# Patient Record
Sex: Female | Born: 2000 | Race: Black or African American | Hispanic: No | Marital: Single | Smoking: Never smoker
Health system: Southern US, Community
[De-identification: ages and names within clinical notes are randomized; demographics above are authoritative.]

## PROBLEM LIST (undated history)

## (undated) ENCOUNTER — Ambulatory Visit: Payer: MEDICAID | Source: Home / Self Care

## (undated) DIAGNOSIS — T7840XA Allergy, unspecified, initial encounter: Secondary | ICD-10-CM

## (undated) DIAGNOSIS — I10 Essential (primary) hypertension: Secondary | ICD-10-CM

## (undated) DIAGNOSIS — E669 Obesity, unspecified: Secondary | ICD-10-CM

## (undated) DIAGNOSIS — F419 Anxiety disorder, unspecified: Secondary | ICD-10-CM

## (undated) HISTORY — PX: EYE SURGERY: SHX253

## (undated) HISTORY — DX: Allergy, unspecified, initial encounter: T78.40XA

## (undated) HISTORY — DX: Obesity, unspecified: E66.9

## (undated) HISTORY — DX: Anxiety disorder, unspecified: F41.9

---
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
## 2022-02-09 ENCOUNTER — Encounter: Payer: Self-pay | Admitting: Pediatrics

## 2022-03-14 ENCOUNTER — Telehealth: Payer: Medicaid Other | Admitting: Physician Assistant

## 2022-03-14 DIAGNOSIS — B3731 Acute candidiasis of vulva and vagina: Secondary | ICD-10-CM

## 2022-03-14 MED ORDER — FLUCONAZOLE 150 MG PO TABS: 150.0000 mg | ORAL_TABLET | ORAL | 0 refills | Status: DC | PRN

## 2022-03-14 NOTE — Patient Instructions (Signed)
?Carla [name], thank you for joining [name], PA-C for today's virtual visit.  While this provider is not your primary care provider (PCP), if your PCP is located in our provider database this encounter information will be shared with them immediately following your visit. ? ?Consent: ?(Patient) Carla [name] provided verbal consent for this virtual visit at the beginning of the encounter. ? ?Current Medications: ? ?Current Outpatient Medications:  ?  fluconazole (DIFLUCAN) 150 MG tablet, Take 1 tablet (150 mg total) by mouth every 3 (three) days as needed., Disp: 2 tablet, Rfl: 0 ?  cefixime (SUPRAX) 400 MG CAPS capsule, Take 1 tablet by mouth once., Disp: 2 capsule, Rfl: 0 ?  doxycycline (VIBRA-TABS) 100 MG tablet, Take 1 tablet (100 mg total) by mouth 2 (two) times daily., Disp: 14 tablet, Rfl: 0 ?  naproxen (NAPROSYN) 500 MG tablet, Take 500 mg by mouth daily as needed., Disp: , Rfl:   ? ?Medications ordered in this encounter:  ?Meds ordered this encounter  ?Medications  ? fluconazole (DIFLUCAN) 150 MG tablet  ?  Sig: Take 1 tablet (150 mg total) by mouth every 3 (three) days as needed.  ?  Dispense:  2 tablet  ?  Refill:  0  ?  Order Specific Question:   Supervising Provider  ?  Answer:   Eber Hong [[id number]]  ?  ? ?*If you need refills on other medications prior to your next appointment, please contact your pharmacy* ? ?Follow-Up: ?Call back or seek an in-person evaluation if the symptoms worsen or if the condition fails to improve as anticipated. ? ?Other Instructions ?Vaginal Yeast Infection, Adult ? ?Vaginal yeast infection is a condition that causes vaginal discharge as well as soreness, swelling, and redness (inflammation) of the vagina. This is a common condition. Some women get this infection frequently. ?What are the causes? ?This condition is caused by a change in the normal balance of the yeast (Candida) and normal bacteria that live in the vagina. This change causes an overgrowth of  yeast, which causes the inflammation. ?What increases the risk? ?The condition is more likely to develop in women who: ?Take antibiotic medicines. ?Have diabetes. ?Take birth control pills. ?Are pregnant. ?Douche often. ?Have a weak body defense system (immune system). ?Have been taking steroid medicines for a long time. ?Frequently wear tight clothing. ?What are the signs or symptoms? ?Symptoms of this condition include: ?White, thick, creamy vaginal discharge. ?Swelling, itching, redness, and irritation of the vagina. The lips of the vagina (labia) may be affected as well. ?Pain or a burning feeling while urinating. ?Pain during sex. ?How is this diagnosed? ?This condition is diagnosed based on: ?Your medical history. ?A physical exam. ?A pelvic exam. Your health care provider will examine a sample of your vaginal discharge under a microscope. Your health care provider may send this sample for testing to confirm the diagnosis. ?How is this treated? ?This condition is treated with medicine. Medicines may be over-the-counter or prescription. You may be told to use one or more of the following: ?Medicine that is taken by mouth (orally). ?Medicine that is applied as a cream (topically). ?Medicine that is inserted directly into the vagina (suppository). ?Follow these instructions at home: ?Take or apply over-the-counter and prescription medicines only as told by your health care provider. ?Do not use tampons until your health care provider approves. ?Do not have sex until your infection has cleared. Sex can prolong or worsen your symptoms of infection. Ask your health care provider when it  is safe to resume sexual activity. ?Keep all follow-up visits. This is important. ?How is this prevented? ? ?Do not wear tight clothes, such as pantyhose or tight pants. ?Wear breathable cotton underwear. ?Do not use douches, perfumed soap, creams, or powders. ?Wipe from front to back after using the toilet. ?If you have diabetes,  keep your blood sugar levels under control. ?Ask your health care provider for other ways to prevent yeast infections. ?Contact a health care provider if: ?You have a fever. ?Your symptoms go away and then return. ?Your symptoms do not get better with treatment. ?Your symptoms get worse. ?You have new symptoms. ?You develop blisters in or around your vagina. ?You have blood coming from your vagina and it is not your menstrual period. ?You develop pain in your abdomen. ?Summary ?Vaginal yeast infection is a condition that causes discharge as well as soreness, swelling, and redness (inflammation) of the vagina. ?This condition is treated with medicine. Medicines may be over-the-counter or prescription. ?Take or apply over-the-counter and prescription medicines only as told by your health care provider. ?Do not douche. Resume sexual activity or use of tampons as instructed by your health care provider. ?Contact a health care provider if your symptoms do not get better with treatment or your symptoms go away and then return. ?This information is not intended to replace advice given to you by your health care provider. Make sure you discuss any questions you have with your health care provider. ?Document Revised: 01/04/2021 Document Reviewed: 01/04/2021 ?Elsevier Patient Education ? 2023 Elsevier Inc. ? ? ? ?If you have been instructed to have an in-person evaluation today at a local Urgent Care facility, please use the link below. It will take you to a list of all of our available [facility], including address, phone number and hours of operation. Please do not delay care.  ?[facility] ? ?If you or a family member do not have a primary care provider, use the link below to schedule a visit and establish care. When you choose a [facility] primary care physician or advanced practice provider, you gain a long-term partner in health. ?Find a Primary Care Provider ? ?Learn more about [facility]'s  in-office and virtual care options: ?[facility] - Get Care Now ?

## 2022-03-14 NOTE — Progress Notes (Signed)
?Virtual Visit Consent  ? ?Carla [name], you are scheduled for a virtual visit with a [facility] provider today. Just as with appointments in the office, your consent must be obtained to participate. Your consent will be active for this visit and any virtual visit you may have with one of our providers in the next 365 days. If you have a MyChart account, a copy of this consent can be sent to you electronically. ? ?As this is a virtual visit, video technology does not allow for your provider to perform a traditional examination. This may limit your provider's ability to fully assess your condition. If your provider identifies any concerns that need to be evaluated in person or the need to arrange testing (such as labs, EKG, etc.), we will make arrangements to do so. Although advances in technology are sophisticated, we cannot ensure that it will always work on either your end or our end. If the connection with a video visit is poor, the visit may have to be switched to a telephone visit. With either a video or telephone visit, we are not always able to ensure that we have a secure connection. ? ?By engaging in this virtual visit, you consent to the provision of healthcare and authorize for your insurance to be billed (if applicable) for the services provided during this visit. Depending on your insurance coverage, you may receive a charge related to this service. ? ?I need to obtain your verbal consent now. Are you willing to proceed with your visit today? Carla [name] has provided verbal consent on 03/14/2022 for a virtual visit (video or telephone). Mar Daring, PA-C ? ?Date: 03/14/2022 2:49 PM ? ?Virtual Visit via Video Note  ? ?Carla [name], connected with  Carla [name]  ([id number], 07/11/2001) on 03/14/22 at  2:45 PM EDT by a video-enabled telemedicine application and verified that I am speaking with the correct person using two identifiers. ? ?Location: ?Patient: Virtual Visit Location Patient:  Home ?Provider: Virtual Visit Location Provider: Home Office ?  ?I discussed the limitations of evaluation and management by telemedicine and the availability of in person appointments. The patient expressed understanding and agreed to proceed.   ? ?History of Present Illness: ?Carla [name] is a 21 y.o. who identifies as a female who was assigned female at birth, and is being seen today for vaginal discharge. Started last week. Having thick, white discharge. Having associated itching. Having increased moisture that is making area itchy.  ? ? ?Problems:  ?Patient Active Problem List  ? Diagnosis Date Noted  ? Throbbing headache 12/06/2019  ? MDD (major depressive disorder), severe ([facility]) 12/07/2017  ?  ?Allergies:  ?Allergies  ?Allergen Reactions  ? Other Hives  ?  "Canned pineapples"  ? ?Medications:  ?Current Outpatient Medications:  ?  fluconazole (DIFLUCAN) 150 MG tablet, Take 1 tablet (150 mg total) by mouth every 3 (three) days as needed., Disp: 2 tablet, Rfl: 0 ?  cefixime (SUPRAX) 400 MG CAPS capsule, Take 1 tablet by mouth once., Disp: 2 capsule, Rfl: 0 ?  doxycycline (VIBRA-TABS) 100 MG tablet, Take 1 tablet (100 mg total) by mouth 2 (two) times daily., Disp: 14 tablet, Rfl: 0 ?  naproxen (NAPROSYN) 500 MG tablet, Take 500 mg by mouth daily as needed., Disp: , Rfl:  ? ?Observations/Objective: ?Patient is well-developed, well-nourished in no acute distress.  ?Resting comfortably at home.  ?Head is normocephalic, atraumatic.  ?No labored breathing.  ?Speech is clear and coherent with logical content.  ?  Patient is alert and oriented at baseline.  ? ? ?Assessment and Plan: ?1. Yeast infection of the vagina ?- fluconazole (DIFLUCAN) 150 MG tablet; Take 1 tablet (150 mg total) by mouth every 3 (three) days as needed.  Dispense: 2 tablet; Refill: 0 ? ?- Suspect yeast infection ?- Diflucan prescribed ?- Vaginal probiotics if interested for prevention ?- Seek in person evaluation for testing if not responding to  medication or if symptoms worsen ? ? ?Follow Up Instructions: ?I discussed the assessment and treatment plan with the patient. The patient was provided an opportunity to ask questions and all were answered. The patient agreed with the plan and demonstrated an understanding of the instructions.  A copy of instructions were sent to the patient via MyChart unless otherwise noted below.  ? ? ?The patient was advised to call back or seek an in-person evaluation if the symptoms worsen or if the condition fails to improve as anticipated. ? ?Time:  ?I spent 10 minutes with the patient via telehealth technology discussing the above problems/concerns.   ? ?Mar Daring, PA-C ?

## 2022-03-21 ENCOUNTER — Ambulatory Visit: Payer: Medicaid Other

## 2022-03-23 ENCOUNTER — Encounter: Payer: Medicaid Other | Admitting: Radiology

## 2022-04-29 ENCOUNTER — Telehealth: Payer: Medicaid Other | Admitting: Physician Assistant

## 2022-04-29 DIAGNOSIS — N898 Other specified noninflammatory disorders of vagina: Secondary | ICD-10-CM

## 2022-04-29 DIAGNOSIS — R102 Pelvic and perineal pain: Secondary | ICD-10-CM

## 2022-04-29 DIAGNOSIS — Z711 Person with feared health complaint in whom no diagnosis is made: Secondary | ICD-10-CM

## 2022-04-29 NOTE — Progress Notes (Signed)
Virtual Visit Consent   Anastassia Noack, you are scheduled for a virtual visit with a [facility] provider today. Just as with appointments in the office, your consent must be obtained to participate. Your consent will be active for this visit and any virtual visit you may have with one of our providers in the next 365 days. If you have a MyChart account, a copy of this consent can be sent to you electronically.  As this is a virtual visit, video technology does not allow for your provider to perform a traditional examination. This may limit your provider's ability to fully assess your condition. If your provider identifies any concerns that need to be evaluated in person or the need to arrange testing (such as labs, EKG, etc.), we will make arrangements to do so. Although advances in technology are sophisticated, we cannot ensure that it will always work on either your end or our end. If the connection with a video visit is poor, the visit may have to be switched to a telephone visit. With either a video or telephone visit, we are not always able to ensure that we have a secure connection.  By engaging in this virtual visit, you consent to the provision of healthcare and authorize for your insurance to be billed (if applicable) for the services provided during this visit. Depending on your insurance coverage, you may receive a charge related to this service.  I need to obtain your verbal consent now. Are you willing to proceed with your visit today? Emani Taussig has provided verbal consent on 04/29/2022 for a virtual visit (video or telephone). [name], PA-C  Date: 04/29/2022 1:09 PM  Virtual Visit via Video Note   I, [name], connected with  Jolane Bankhead  ([id number], 2000/12/18) on 04/29/22 at  1:00 PM EDT by a video-enabled telemedicine application and verified that I am speaking with the correct person using two identifiers.  Location: Patient: Virtual Visit Location Patient:  Home Provider: Virtual Visit Location Provider: Home Office   I discussed the limitations of evaluation and management by telemedicine and the availability of in person appointments. The patient expressed understanding and agreed to proceed.    History of Present Illness: Collie Kittel is a 21 y.o. who identifies as a female who was assigned female at birth, and is being seen today for vaginal pain and possible infection.  HPI: Vaginal Pain The patient's primary symptoms include genital lesions (2 "white head spots"), pelvic pain and vaginal discharge. The patient's pertinent negatives include no genital itching, genital odor or genital rash. This is a new problem. The current episode started in the past 7 days (3-4 days ago noticed a white head on external tissues). The problem occurs constantly. The problem has been gradually worsening. The pain is mild. She is not pregnant. Associated symptoms include painful intercourse. Pertinent negatives include no abdominal pain, back pain, fever, flank pain or urgency. Nothing (possibly shaving) aggravates the symptoms. Treatments tried: topical medication for acne, improved one lesion, this one getting smaller too. The treatment provided no relief. She is sexually active. It is unknown whether or not her partner has an STD.   Does have h/o STI and reports symptoms are similar, she is concerned she may have an STI again.    Problems:  Patient Active Problem List   Diagnosis Date Noted   Throbbing headache 12/06/2019   MDD (major depressive disorder), severe (HCC) 12/07/2017    Allergies:  Allergies  Allergen Reactions  Other Hives    "Canned pineapples"   Medications:  Current Outpatient Medications:    cefixime (SUPRAX) 400 MG CAPS capsule, Take 1 tablet by mouth once., Disp: 2 capsule, Rfl: 0   doxycycline (VIBRA-TABS) 100 MG tablet, Take 1 tablet (100 mg total) by mouth 2 (two) times daily., Disp: 14 tablet, Rfl: 0   fluconazole (DIFLUCAN)  150 MG tablet, Take 1 tablet (150 mg total) by mouth every 3 (three) days as needed., Disp: 2 tablet, Rfl: 0   naproxen (NAPROSYN) 500 MG tablet, Take 500 mg by mouth daily as needed., Disp: , Rfl:   Observations/Objective: Patient is well-developed, well-nourished in no acute distress.  Resting comfortably at home.  Head is normocephalic, atraumatic.  No labored breathing.  Speech is clear and coherent with logical content.  Patient is alert and oriented at baseline.    Assessment and Plan: 1. Vaginal pain  2. Vaginal lesion  3. Concern about sexually transmitted disease in female without diagnosis  - Advised since concern for STI recommend in person evaluation. Facility list provided via AVS  Follow Up Instructions: I discussed the assessment and treatment plan with the patient. The patient was provided an opportunity to ask questions and all were answered. The patient agreed with the plan and demonstrated an understanding of the instructions.  A copy of instructions were sent to the patient via MyChart unless otherwise noted below.    The patient was advised to call back or seek an in-person evaluation if the symptoms worsen or if the condition fails to improve as anticipated.  Time:  I spent 10 minutes with the patient via telehealth technology discussing the above problems/concerns.    [name], PA-C

## 2022-04-29 NOTE — Patient Instructions (Signed)
Hosie Spangle, thank you for joining [name], PA-C for today's virtual visit.  While this provider is not your primary care provider (PCP), if your PCP is located in our provider database this encounter information will be shared with them immediately following your visit.  Consent: (Patient) Hosie Spangle provided verbal consent for this virtual visit at the beginning of the encounter.  Current Medications:  Current Outpatient Medications:    cefixime (SUPRAX) 400 MG CAPS capsule, Take 1 tablet by mouth once., Disp: 2 capsule, Rfl: 0   doxycycline (VIBRA-TABS) 100 MG tablet, Take 1 tablet (100 mg total) by mouth 2 (two) times daily., Disp: 14 tablet, Rfl: 0   fluconazole (DIFLUCAN) 150 MG tablet, Take 1 tablet (150 mg total) by mouth every 3 (three) days as needed., Disp: 2 tablet, Rfl: 0   naproxen (NAPROSYN) 500 MG tablet, Take 500 mg by mouth daily as needed., Disp: , Rfl:    Medications ordered in this encounter:  No orders of the defined types were placed in this encounter.    *If you need refills on other medications prior to your next appointment, please contact your pharmacy*  Follow-Up: Call back or seek an in-person evaluation if the symptoms worsen or if the condition fails to improve as anticipated.  Other Instructions  [facility] Department [address] [place], 2nd floor is STI testing [phone]  *Center for [facility] at [facility]             [address], [place], [place] [address] [phone] (*Take patients with no insurance)  *Center for [employer] at [facility] [address] [place],  [place]  [address] [phone] (*Take patients with no insurance)  Center for [employer] at [employer]                                                             [address], [address], [place], [place], [address] [phone]  Center for [facility] at [facility] [address], [place], [place], [address] [phone]  Center for [facility] at [facility] [address], [place], [place], [address] [phone]  Center for [facility] at [facility]                                 [address] [place], [place], [place], [address] [phone]  Center for [facility] at [facility]                                    [address] [place], [place], [address] [phone]  Center for [facility] at [facility] [address], [place], [place], [address]                              [facility] of [place] [address], [place], [place],  [address] [phone]     If you have been instructed to have an in-person evaluation today at a local Urgent Care facility, please use the link below. It will take you to a list of all of our available [facility], including address, phone number and hours of operation. Please do not delay care.  [facility]  If you or a family member do not have a primary care provider, use the link below to schedule a visit and establish care. When you choose a [facility] primary care physician or advanced practice provider, you gain a long-term partner in health. Find a Primary Care Provider  Learn more about [facility]'s in-office and virtual care options: [facility] - Get Care Now

## 2022-05-20 ENCOUNTER — Encounter: Payer: Medicaid Other | Admitting: Radiology

## 2022-06-23 ENCOUNTER — Ambulatory Visit (INDEPENDENT_AMBULATORY_CARE_PROVIDER_SITE_OTHER): Payer: Medicaid Other | Admitting: Family Medicine

## 2022-06-23 ENCOUNTER — Encounter: Payer: Self-pay | Admitting: Family Medicine

## 2022-06-23 VITALS — BP 122/88 | HR 99 | Temp 97.7°F | Resp 16 | Ht 67.0 in | Wt 296.4 lb

## 2022-06-23 DIAGNOSIS — Z7689 Persons encountering health services in other specified circumstances: Secondary | ICD-10-CM

## 2022-06-23 DIAGNOSIS — Z30011 Encounter for initial prescription of contraceptive pills: Secondary | ICD-10-CM

## 2022-06-23 DIAGNOSIS — Z202 Contact with and (suspected) exposure to infections with a predominantly sexual mode of transmission: Secondary | ICD-10-CM

## 2022-06-23 MED ORDER — NORGESTIMATE-ETH ESTRADIOL 0.25-35 MG-MCG PO TABS: 1.0000 | ORAL_TABLET | Freq: Every day | ORAL | 0 refills | Status: DC

## 2022-06-23 NOTE — Progress Notes (Signed)
Patient here here for sexually transmitted disease test The current method of family planning is none. Patient would like to discuss

## 2022-06-24 ENCOUNTER — Other Ambulatory Visit (HOSPITAL_COMMUNITY)
Admission: RE | Admit: 2022-06-24 | Discharge: 2022-06-24 | Disposition: A | Discharge status: Home / Self Care | Payer: Medicaid Other | Source: Ambulatory Visit | Attending: Family Medicine | Admitting: Family Medicine

## 2022-06-24 DIAGNOSIS — Z202 Contact with and (suspected) exposure to infections with a predominantly sexual mode of transmission: Secondary | ICD-10-CM | POA: Diagnosis present

## 2022-06-24 NOTE — Progress Notes (Signed)
New Patient Office Visit  Subjective    Patient ID: Carla [name], female    DOB: November 30, 2000  Age: 21 y.o. MRN: [id number]  CC:  Chief Complaint  Patient presents with   Exposure to STD   Contraception    HPI Carla [name] presents to establish care and with complaint of possible exposure to STD. Patient also reports that she desires birth control. Patient denies acute complaints.    Outpatient Encounter Medications as of 06/23/2022  Medication Sig   naproxen (NAPROSYN) 500 MG tablet Take 500 mg by mouth daily as needed.   norgestimate-ethinyl estradiol (ORTHO-CYCLEN) 0.25-35 MG-MCG tablet Take 1 tablet by mouth daily.   [DISCONTINUED] cefixime (SUPRAX) 400 MG CAPS capsule Take 1 tablet by mouth once.   [DISCONTINUED] doxycycline (VIBRA-TABS) 100 MG tablet Take 1 tablet (100 mg total) by mouth 2 (two) times daily.   [DISCONTINUED] fluconazole (DIFLUCAN) 150 MG tablet Take 1 tablet (150 mg total) by mouth every 3 (three) days as needed.   No facility-administered encounter medications on file as of 06/23/2022.    Past Medical History:  Diagnosis Date   Obesity     Past Surgical History:  Procedure Laterality Date   EYE SURGERY      Family History  Problem Relation Age of Onset   Obesity Father    Hypertension Father    Obesity Paternal Aunt    Lung cancer Maternal Grandfather    Migraines Neg Hx    Seizures Neg Hx    Depression Neg Hx    Anxiety disorder Neg Hx    Bipolar disorder Neg Hx    Schizophrenia Neg Hx    ADD / ADHD Neg Hx    Autism Neg Hx     Social History   Socioeconomic History   Marital status: Single    Spouse name: Not on file   Number of children: Not on file   Years of education: Not on file   Highest education level: Not on file  Occupational History   Not on file  Tobacco Use   Smoking status: Never    Passive exposure: Yes   Smokeless tobacco: Never  Substance and Sexual Activity   Alcohol use: No   Drug use: No   Sexual  activity: Yes  Other Topics Concern   Not on file  Social History Narrative   Carla [name] is in the 11th grade at [employer]; she does well in school. Mother, Step father, Sister.   Social Determinants of Health   Financial Resource Strain: Not on file  Food Insecurity: Not on file  Transportation Needs: Not on file  Physical Activity: Not on file  Stress: Not on file  Social Connections: Not on file  Intimate Partner Violence: Not on file    Review of Systems  All other systems reviewed and are negative.       Objective    BP 122/88   Pulse 99   Temp 97.7 F (36.5 C) (Oral)   Resp 16   Ht 5\' 7"  (1.702 m)   Wt 296 lb 6.4 oz (134.4 kg)   LMP 06/04/2022   SpO2 97%   BMI 46.42 kg/m   Physical Exam Vitals and nursing note reviewed.  Constitutional:      General: She is not in acute distress.    Appearance: She is obese.  Cardiovascular:     Rate and Rhythm: Normal rate and regular rhythm.  Pulmonary:     Effort: Pulmonary effort is normal.  Breath sounds: Normal breath sounds.  Abdominal:     Palpations: Abdomen is soft.     Tenderness: There is no abdominal tenderness.  Neurological:     General: No focal deficit present.     Mental Status: She is alert and oriented to person, place, and time.         Assessment & Plan:   1. Possible exposure to STD Cultures pending - Cervicovaginal ancillary only  2. Encounter for initial prescription of contraceptive pills Ortho cyclen prescribed.  3. Encounter to establish care     Return in about 6 weeks (around 08/04/2022) for physical.   Tommie Raymond, MD

## 2022-06-27 LAB — CERVICOVAGINAL ANCILLARY ONLY
Bacterial Vaginitis (gardnerella): POSITIVE — AB
Candida Glabrata: NEGATIVE
Candida Vaginitis: NEGATIVE
Chlamydia: POSITIVE — AB
Comment: NEGATIVE
Comment: NEGATIVE
Comment: NEGATIVE
Comment: NEGATIVE
Comment: NEGATIVE
Comment: NORMAL
Neisseria Gonorrhea: NEGATIVE
Trichomonas: NEGATIVE

## 2022-06-28 ENCOUNTER — Telehealth: Payer: Medicaid Other | Admitting: Physician Assistant

## 2022-06-28 ENCOUNTER — Encounter: Payer: Medicaid Other | Admitting: Nurse Practitioner

## 2022-06-28 ENCOUNTER — Other Ambulatory Visit: Payer: Self-pay | Admitting: Family Medicine

## 2022-06-28 DIAGNOSIS — B9689 Other specified bacterial agents as the cause of diseases classified elsewhere: Secondary | ICD-10-CM

## 2022-06-28 DIAGNOSIS — N760 Acute vaginitis: Secondary | ICD-10-CM | POA: Diagnosis not present

## 2022-06-28 DIAGNOSIS — A749 Chlamydial infection, unspecified: Secondary | ICD-10-CM

## 2022-06-28 MED ORDER — DOXYCYCLINE HYCLATE 100 MG PO TABS: 100.0000 mg | ORAL_TABLET | Freq: Two times a day (BID) | ORAL | 0 refills | Status: DC

## 2022-06-28 MED ORDER — METRONIDAZOLE 500 MG PO TABS: 500.0000 mg | ORAL_TABLET | Freq: Two times a day (BID) | ORAL | 0 refills | Status: DC

## 2022-06-28 NOTE — Progress Notes (Signed)
Carla [name],  Because you have a mixture of symptoms and a new recent sexual partner it is best you are tested for STIs to assure your symptoms are not from a specific bacterial infection.   Please contact your primary care physician practice to be seen. Many offices offer virtual options to be seen via video if you are not comfortable going in person to a medical facility at this time.  NOTE: You will NOT be charged for this eVisit.  If you do not have a PCP, [name] offers a free physician referral service available at [phone]. Our trained staff has the experience, knowledge and resources to put you in touch with a physician who is right for you.    If you are having a true medical emergency please call [phone].   Your e-visit answers were reviewed by a board certified advanced clinical practitioner to complete your personal care plan.  Thank you for using e-Visits.

## 2022-06-28 NOTE — Progress Notes (Signed)
Virtual Visit Consent   Carla [name], you are scheduled for a virtual visit with a [facility] provider today. Just as with appointments in the office, your consent must be obtained to participate. Your consent will be active for this visit and any virtual visit you may have with one of our providers in the next 365 days. If you have a MyChart account, a copy of this consent can be sent to you electronically.  As this is a virtual visit, video technology does not allow for your provider to perform a traditional examination. This may limit your provider's ability to fully assess your condition. If your provider identifies any concerns that need to be evaluated in person or the need to arrange testing (such as labs, EKG, etc.), we will make arrangements to do so. Although advances in technology are sophisticated, we cannot ensure that it will always work on either your end or our end. If the connection with a video visit is poor, the visit may have to be switched to a telephone visit. With either a video or telephone visit, we are not always able to ensure that we have a secure connection.  By engaging in this virtual visit, you consent to the provision of healthcare and authorize for your insurance to be billed (if applicable) for the services provided during this visit. Depending on your insurance coverage, you may receive a charge related to this service.  I need to obtain your verbal consent now. Are you willing to proceed with your visit today? Nadira Single has provided verbal consent on 06/28/2022 for a virtual visit (video or telephone). Piedad Climes, [place]  Date: 06/28/2022 10:56 AM  Virtual Visit via Video Note   I, Piedad Climes, connected with  Darin Redmann  ([id number], [birth year]) on 06/28/22 at 10:45 AM EDT by a video-enabled telemedicine application and verified that I am speaking with the correct person using two identifiers.  Location: Patient: Virtual Visit Location Patient:  Home Provider: Virtual Visit Location Provider: Home Office   I discussed the limitations of evaluation and management by telemedicine and the availability of in person appointments. The patient expressed understanding and agreed to proceed.    History of Present Illness: Carla [name] is a 21 y.o. who identifies as a female who was assigned female at birth, and is being seen today for treatment of abnormal cervicovaginal testing done at recent visit with her PCP. Notes being diagnosed and treated for gonorrhea and chlamydia about 2-3 months ago. Recently established with new PCP and wanted repeat testing. Test results came back as of yesterday with + BV and chlamydia. Has not heard from her PCP and when trying to get in touch with them states she cannot get through to anyone. Notes sexual activity with same partner that she got chalmydia from last time.   HPI: HPI  Problems:  Patient Active Problem List   Diagnosis Date Noted   Throbbing headache 12/06/2019   MDD (major depressive disorder), severe (HCC) 12/07/2017    Allergies:  Allergies  Allergen Reactions   Other Hives    "Canned pineapples"   Medications:  Current Outpatient Medications:    doxycycline (VIBRA-TABS) 100 MG tablet, Take 1 tablet (100 mg total) by mouth 2 (two) times daily., Disp: 14 tablet, Rfl: 0   metroNIDAZOLE (FLAGYL) 500 MG tablet, Take 1 tablet (500 mg total) by mouth 2 (two) times daily., Disp: 14 tablet, Rfl: 0   naproxen (NAPROSYN) 500 MG tablet, Take 500 mg by mouth  daily as needed., Disp: , Rfl:    norgestimate-ethinyl estradiol (ORTHO-CYCLEN) 0.25-35 MG-MCG tablet, Take 1 tablet by mouth daily., Disp: 84 tablet, Rfl: 0  Observations/Objective: Patient is well-developed, well-nourished in no acute distress.  Resting comfortably at home.  Head is normocephalic, atraumatic.  No labored breathing. Speech is clear and coherent with logical content.  Patient is alert and oriented at baseline.   Assessment  and Plan: 1. BV (bacterial vaginosis) - metroNIDAZOLE (FLAGYL) 500 MG tablet; Take 1 tablet (500 mg total) by mouth 2 (two) times daily.  Dispense: 14 tablet; Refill: 0  2. Chlamydia - doxycycline (VIBRA-TABS) 100 MG tablet; Take 1 tablet (100 mg total) by mouth 2 (two) times daily.  Dispense: 14 tablet; Refill: 0  Is mildly symptomatic. Denies pelvic pain, fever, nausea/vomiting to raise concern for PID. Giving positive results in system, will start her on Doxycycline and Flagyl to treat chlamydia and BV. Discussed need for no sexual activity until treatment is complete, symptoms are fully resolved and she has follow-up with PCP for test of cure.  Follow Up Instructions: I discussed the assessment and treatment plan with the patient. The patient was provided an opportunity to ask questions and all were answered. The patient agreed with the plan and demonstrated an understanding of the instructions.  A copy of instructions were sent to the patient via MyChart unless otherwise noted below.   The patient was advised to call back or seek an in-person evaluation if the symptoms worsen or if the condition fails to improve as anticipated.  Time:  I spent 10 minutes with the patient via telehealth technology discussing the above problems/concerns.    Piedad Climes, PA-C

## 2022-06-28 NOTE — Patient Instructions (Signed)
Carla [name], thank you for joining Piedad Climes, PA-C for today's virtual visit.  While this provider is not your primary care provider (PCP), if your PCP is located in our provider database this encounter information will be shared with them immediately following your visit.  Consent: (Patient) Carla [name] provided verbal consent for this virtual visit at the beginning of the encounter.  Current Medications:  Current Outpatient Medications:    doxycycline (VIBRA-TABS) 100 MG tablet, Take 1 tablet (100 mg total) by mouth 2 (two) times daily., Disp: 14 tablet, Rfl: 0   metroNIDAZOLE (FLAGYL) 500 MG tablet, Take 1 tablet (500 mg total) by mouth 2 (two) times daily., Disp: 14 tablet, Rfl: 0   naproxen (NAPROSYN) 500 MG tablet, Take 500 mg by mouth daily as needed., Disp: , Rfl:    norgestimate-ethinyl estradiol (ORTHO-CYCLEN) 0.25-35 MG-MCG tablet, Take 1 tablet by mouth daily., Disp: 84 tablet, Rfl: 0   Medications ordered in this encounter:  Meds ordered this encounter  Medications   doxycycline (VIBRA-TABS) 100 MG tablet    Sig: Take 1 tablet (100 mg total) by mouth 2 (two) times daily.    Dispense:  14 tablet    Refill:  0    Order Specific Question:   Supervising Provider    Answer:   MILLER, BRIAN [[id number]]   metroNIDAZOLE (FLAGYL) 500 MG tablet    Sig: Take 1 tablet (500 mg total) by mouth 2 (two) times daily.    Dispense:  14 tablet    Refill:  0    Order Specific Question:   Supervising Provider    Answer:   Hyacinth Meeker, BRIAN [[id number]]     *If you need refills on other medications prior to your next appointment, please contact your pharmacy*  Follow-Up: Call back or seek an in-person evaluation if the symptoms worsen or if the condition fails to improve as anticipated.  Other Instructions Chlamydia, Female Chlamydia is a sexually transmitted infection (STI). This infection spreads through sexual contact. The infection can grow in: The urethra. This is the part of the  body that drains pee (urine) from the bladder. The cervix. This is the lowest part of the womb (uterus). The throat. The opening of the butt (rectum). This condition is not hard to treat. But if it is not treated, it can cause worse health problems. You may have a higher risk of not being able to have children. Also, if you are pregnant or get pregnant and have untreated chlamydia: It can cause serious problems during pregnancy. It can spread to your baby during delivery and cause your baby to have health problems. What are the causes?  This condition is caused by a germ (bacteria) called Chlamydia trachomatis. These germs are spread from an infected partner during sex. The infection can spread through contact with the genitals, mouth, or the opening of the butt (rectum). What increases the risk? Not using a condom the right way. Not using a condom every time you have sex. Having a new sex partner. Having more than one sex partner. Being sexually active before age 42. What are the signs or symptoms? In some cases, there are no symptoms, especially early in the illness. If you get symptoms, they may include: Peeing often, or a burning feeling when you pee. Redness, soreness, or swelling of the vagina or butt. Fluid (discharge) coming from the vagina or butt. Pain the belly (abdomen). Pain during sex. Bleeding between monthly periods or irregular periods. How is this treated? This  condition is treated with antibiotic medicines. Follow these instructions at home: Sexual activity Tell your sex partner or partners about your infection. Sex partners are people you had oral, anal, or vaginal sex with within 60 days of when you started getting sick. They need treatment even if they do not feel or seem sick. Do not have sex until: You and your sex partners have been treated. Your doctor says it is okay. If you get just one dose of medicine, wait at least 7 days before having sex. General  instructions Take over-the-counter and prescription medicines as told by your doctor. Finish your antibiotics even if you start to feel better. It is up to you to get your test results. Ask how to get your results when they are ready. Keep all follow-up visits. You may need tests after 3 months. How is this prevented? To lower your risk: Use latex or polyurethane condoms the right way. Do this every time you have sex. Do not have many sex partners. Ask if your sex partner got tested for STIs and had negative results. Get regular health screenings to check for STIs. Contact a doctor if: You get new symptoms. Your symptoms are getting worse or do not get better with treatment. You have a fever or chills. You have pain during sex. Your periods are irregular. You bleed between periods or after sex. You get flu-like symptoms. These may be: Night sweats. Sore throat. Muscle aches. You are unable to take your antibiotic medicine as prescribed. Summary Chlamydia is an infection that spreads through sexual contact. This condition is treated with antibiotics. If it is not treated, it can cause health problems. Your sex partners will also need to be treated. Do not have sex until both you and your partner have been treated. Take all medicines as told and keep all follow-up visits. This information is not intended to replace advice given to you by your health care provider. Make sure you discuss any questions you have with your health care provider. Document Revised: 07/26/2021 Document Reviewed: 07/26/2021 Elsevier Patient Education  2023 Elsevier Inc.    If you have been instructed to have an in-person evaluation today at a local Urgent Care facility, please use the link below. It will take you to a list of all of our available [facility], including address, phone number and hours of operation. Please do not delay care.  [facility]  If you or a family member do  not have a primary care provider, use the link below to schedule a visit and establish care. When you choose a [facility] primary care physician or advanced practice provider, you gain a long-term partner in health. Find a Primary Care Provider  Learn more about [facility]'s in-office and virtual care options: [facility] - Get Care Now

## 2022-08-04 ENCOUNTER — Encounter: Payer: Self-pay | Admitting: Family Medicine

## 2022-08-04 ENCOUNTER — Ambulatory Visit (INDEPENDENT_AMBULATORY_CARE_PROVIDER_SITE_OTHER): Payer: Medicaid Other | Admitting: Family Medicine

## 2022-08-04 VITALS — BP 122/87 | HR 84 | Temp 98.1°F | Resp 16 | Ht 67.0 in | Wt 292.8 lb

## 2022-08-04 DIAGNOSIS — Z130 Encounter for screening for diseases of the blood and blood-forming organs and certain disorders involving the immune mechanism: Secondary | ICD-10-CM

## 2022-08-04 DIAGNOSIS — Z1329 Encounter for screening for other suspected endocrine disorder: Secondary | ICD-10-CM

## 2022-08-04 DIAGNOSIS — Z0000 Encounter for general adult medical examination without abnormal findings: Secondary | ICD-10-CM | POA: Diagnosis not present

## 2022-08-04 DIAGNOSIS — Z13228 Encounter for screening for other metabolic disorders: Secondary | ICD-10-CM

## 2022-08-04 DIAGNOSIS — Z13220 Encounter for screening for lipoid disorders: Secondary | ICD-10-CM

## 2022-08-04 NOTE — Progress Notes (Signed)
Established Patient Office Visit  Subjective    Patient ID: Carla [name], female    DOB: 2000-11-02  Age: 21 y.o. MRN: [id number]  CC:  Chief Complaint  Patient presents with   Annual Exam   Gynecologic Exam    HPI Carla [name] presents for routine annual exam. Patient denies acute complaints or concerns.    Outpatient Encounter Medications as of 08/04/2022  Medication Sig   norgestimate-ethinyl estradiol (ORTHO-CYCLEN) 0.25-35 MG-MCG tablet Take 1 tablet by mouth daily.   doxycycline (VIBRA-TABS) 100 MG tablet Take 1 tablet (100 mg total) by mouth 2 (two) times daily. (Patient not taking: Reported on 08/04/2022)   metroNIDAZOLE (FLAGYL) 500 MG tablet Take 1 tablet (500 mg total) by mouth 2 (two) times daily. (Patient not taking: Reported on 08/04/2022)   naproxen (NAPROSYN) 500 MG tablet Take 500 mg by mouth daily as needed. (Patient not taking: Reported on 08/04/2022)   No facility-administered encounter medications on file as of 08/04/2022.    Past Medical History:  Diagnosis Date   Allergy    Canned Pineapples   Anxiety 2018   Obesity     Past Surgical History:  Procedure Laterality Date   EYE SURGERY      Family History  Problem Relation Age of Onset   Obesity Father    Hypertension Father    Obesity Paternal Aunt    Cancer Maternal Grandmother    Lung cancer Maternal Grandfather    Migraines Neg Hx    Seizures Neg Hx    Depression Neg Hx    Anxiety disorder Neg Hx    Bipolar disorder Neg Hx    Schizophrenia Neg Hx    ADD / ADHD Neg Hx    Autism Neg Hx     Social History   Socioeconomic History   Marital status: Single    Spouse name: Not on file   Number of children: Not on file   Years of education: Not on file   Highest education level: Not on file  Occupational History   Not on file  Tobacco Use   Smoking status: Never    Passive exposure: Yes   Smokeless tobacco: Never  Substance and Sexual Activity   Alcohol use: No   Drug use: No    Sexual activity: Yes  Other Topics Concern   Not on file  Social History Narrative   Kyndel is in the 11th grade at [employer]; she does well in school. Mother, Step father, Sister.   Social Determinants of Health   Financial Resource Strain: Not on file  Food Insecurity: Not on file  Transportation Needs: Not on file  Physical Activity: Not on file  Stress: Not on file  Social Connections: Not on file  Intimate Partner Violence: Not on file    Review of Systems  All other systems reviewed and are negative.       Objective    BP 122/87   Pulse 84   Temp 98.1 F (36.7 C) (Oral)   Resp 16   Ht 5' 7" (1.702 m)   Wt 292 lb 12.8 oz (132.8 kg)   SpO2 96%   BMI 45.86 kg/m   Physical Exam Vitals and nursing note reviewed.  Constitutional:      General: She is not in acute distress.    Appearance: She is obese.  HENT:     Head: Normocephalic and atraumatic.     Right Ear: Tympanic membrane, ear canal and external ear normal.  Left Ear: Tympanic membrane, ear canal and external ear normal.     Nose: Nose normal.     Mouth/Throat:     Mouth: Mucous membranes are moist.     Pharynx: Oropharynx is clear.  Eyes:     Conjunctiva/sclera: Conjunctivae normal.     Pupils: Pupils are equal, round, and reactive to light.  Neck:     Thyroid: No thyromegaly.  Cardiovascular:     Rate and Rhythm: Normal rate and regular rhythm.     Heart sounds: Normal heart sounds. No murmur heard. Pulmonary:     Effort: Pulmonary effort is normal. No respiratory distress.     Breath sounds: Normal breath sounds.  Abdominal:     General: There is no distension.     Palpations: Abdomen is soft. There is no mass.     Tenderness: There is no abdominal tenderness.  Musculoskeletal:        General: Normal range of motion.     Cervical back: Normal range of motion and neck supple.  Skin:    General: Skin is warm and dry.  Neurological:     General: No focal deficit present.     Mental  Status: She is alert and oriented to person, place, and time.  Psychiatric:        Mood and Affect: Mood normal.        Behavior: Behavior normal.         Assessment & Plan:   1. Annual physical exam  - CMP14+EGFR  2. Screening for deficiency anemia  - CBC with Differential  3. Screening for lipid disorders  - Lipid Panel  4. Screening for endocrine/metabolic/immunity disorders  - Hemoglobin A1c - TSH    Return in about 1 year (around 08/05/2023) for physical.   Becky Sax, MD

## 2022-08-04 NOTE — Progress Notes (Signed)
complete physical examination with pap today Patient has no other concerns today

## 2022-08-05 LAB — HEMOGLOBIN A1C
Est. average glucose Bld gHb Est-mCnc: 103 mg/dL
Hgb A1c MFr Bld: 5.2 % (ref 4.8–5.6)

## 2022-08-05 LAB — CMP14+EGFR
ALT: 8 IU/L (ref 0–32)
AST: 19 IU/L (ref 0–40)
Albumin/Globulin Ratio: 1.5 (ref 1.2–2.2)
Albumin: 4.3 g/dL (ref 4.0–5.0)
Alkaline Phosphatase: 63 IU/L (ref 42–106)
BUN/Creatinine Ratio: 18 (ref 9–23)
BUN: 15 mg/dL (ref 6–20)
Bilirubin Total: <0.2 mg/dL (ref 0.0–1.2)
CO2: 21 mmol/L (ref 20–29)
Calcium: 9.8 mg/dL (ref 8.7–10.2)
Chloride: 107 mmol/L — ABNORMAL HIGH (ref 96–106)
Creatinine, Ser: 0.83 mg/dL (ref 0.57–1.00)
Globulin, Total: 2.9 g/dL (ref 1.5–4.5)
Glucose: 82 mg/dL (ref 70–99)
Potassium: 4.7 mmol/L (ref 3.5–5.2)
Sodium: 144 mmol/L (ref 134–144)
Total Protein: 7.2 g/dL (ref 6.0–8.5)
eGFR: 104 mL/min/{1.73_m2} (ref >59)

## 2022-08-05 LAB — CBC WITH DIFFERENTIAL/PLATELET
Basophils Absolute: 0 10*3/uL (ref 0.0–0.2)
Basos: 1 %
EOS (ABSOLUTE): 0.1 10*3/uL (ref 0.0–0.4)
Eos: 2 %
Hematocrit: 37.5 % (ref 34.0–46.6)
Hemoglobin: 12.4 g/dL (ref 11.1–15.9)
Immature Grans (Abs): 0 10*3/uL (ref 0.0–0.1)
Immature Granulocytes: 0 %
Lymphocytes Absolute: 2 10*3/uL (ref 0.7–3.1)
Lymphs: 52 %
MCH: 29.2 pg (ref 26.6–33.0)
MCHC: 33.1 g/dL (ref 31.5–35.7)
MCV: 88 fL (ref 79–97)
Monocytes Absolute: 0.3 10*3/uL (ref 0.1–0.9)
Monocytes: 8 %
Neutrophils Absolute: 1.4 10*3/uL (ref 1.4–7.0)
Neutrophils: 37 %
Platelets: 361 10*3/uL (ref 150–450)
RBC: 4.24 x10E6/uL (ref 3.77–5.28)
RDW: 13.1 % (ref 11.7–15.4)
WBC: 3.8 10*3/uL (ref 3.4–10.8)

## 2022-08-05 LAB — LIPID PANEL
Chol/HDL Ratio: 3.2 ratio (ref 0.0–4.4)
Cholesterol, Total: 146 mg/dL (ref 100–169)
HDL: 46 mg/dL (ref >39)
LDL Chol Calc (NIH): 89 mg/dL (ref 0–109)
Triglycerides: 49 mg/dL (ref 0–89)
VLDL Cholesterol Cal: 11 mg/dL (ref 5–40)

## 2022-08-05 LAB — TSH: TSH: 1.82 u[IU]/mL (ref 0.450–4.500)

## 2022-08-17 ENCOUNTER — Ambulatory Visit: Payer: Self-pay | Admitting: *Deleted

## 2022-08-17 NOTE — Telephone Encounter (Signed)
  Chief Complaint: pelvic pain Symptoms: pain in pelvis and down both legs Frequency: constantly Pertinent Negatives: Patient denies n/a Disposition: [] ED /[] Urgent Care (no appt availability in office) / [] Appointment(In office/virtual)/ []  [facility]/ [] Home Care/ [] Refused Recommended Disposition /[] [facility]/ []  Follow-up with PCP Additional Notes: Appt made with Dr. Jacklynn Barnacle

## 2022-08-17 NOTE — Telephone Encounter (Signed)
I had chlamdia and gon.   I finsihed the meds.    I'm calling I might get Pelvic Inflammatory.   My pelvic area is hurting and hurting down my leg.    Been sick for several days.    Answer Assessment - Initial Assessment Questions 1. LOCATION: "Where does it hurt?"      My whole pelvis area hurts really bad.   I think it's from Pelvic Inflammatory Disease the dr. Mickey Farber I might get from having chlamydia and gonorrhea.   2. RADIATION: "Does the pain shoot anywhere else?" (e.g., lower back, groin, thighs)     Down both of my legs to my ankles. 3. ONSET: "When did the pain begin?" (e.g., minutes, hours or days ago)      I finished the medications she prescribed for me but I'm having this terrible pain in my pelvis.    I've been sick for several days. 4. SUDDEN: "Gradual or sudden onset?"     Not asked 5. PATTERN "Does the pain come and go, or is it constant?"    - If constant: "Is it getting better, staying the same, or worsening?"      (Note: Constant means the pain never goes away completely; most serious pain is constant and gets worse over time)     - If intermittent: "How long does it last?" "Do you have pain now?"     (Note: Intermittent means the pain goes away completely between bouts)     Constant 6. SEVERITY: "How bad is the pain?"  (e.g., Scale 1-10; mild, moderate, or severe)   - MILD (1-3): doesn't interfere with normal activities, area soft and not tender to touch    - MODERATE (4-7): interferes with normal activities or awakens from sleep, abdomen tender to touch    - SEVERE (8-10): excruciating pain, doubled over, unable to do any normal activities      Severe 7. RECURRENT SYMPTOM: "Have you ever had this type of pelvic pain before?" If Yes, ask: "When was the last time?" and "What happened that time?"      Yes   Took medications but still having pain. 8. CAUSE: "What do you think is causing the pelvic pain?"     Having and gonorrhea  9. RELIEVING/AGGRAVATING FACTORS: "What  makes it better or worse?" (e.g., activity/rest, sexual intercourse, voiding, passing stool)     Nothing is helping 10. OTHER SYMPTOMS: "Has there been any other symptoms?" (e.g., fever, constipation, diarrhea, urine problems, vaginal bleeding, vaginal discharge, or vomiting?"       No 11. PREGNANCY: "Is there any chance you are pregnant?" "When was your last menstrual period?"       Not asked  Protocols used: Pelvic Pain - [facility]

## 2022-08-30 ENCOUNTER — Ambulatory Visit: Payer: Medicaid Other | Admitting: Family Medicine

## 2023-01-30 ENCOUNTER — Encounter (HOSPITAL_COMMUNITY): Payer: Self-pay | Admitting: Emergency Medicine

## 2023-01-30 ENCOUNTER — Ambulatory Visit (HOSPITAL_COMMUNITY)
Admission: EM | Admit: 2023-01-30 | Discharge: 2023-01-30 | Disposition: A | Discharge status: Home / Self Care | Payer: Medicaid Other | Attending: Family Medicine | Admitting: Family Medicine

## 2023-01-30 DIAGNOSIS — H1033 Unspecified acute conjunctivitis, bilateral: Secondary | ICD-10-CM

## 2023-01-30 MED ORDER — GENTAMICIN SULFATE 0.3 % OP SOLN: 2.0000 [drp] | Freq: Three times a day (TID) | OPHTHALMIC | 0 refills | Status: AC

## 2023-01-30 NOTE — ED Provider Notes (Signed)
[facility]    CSN: [id number] Arrival date & time: 01/30/23  [id number]      History   Chief Complaint Chief Complaint  Patient presents with   Eye Drainage    HPI Carla [name] is a 22 y.o. female.   HPI Here for irritation and eye drainage it has been going on for about 3 days.  Right before this began she had put some lash extensions on her eyes and then her eyes were irritated so she took them off.  She shows me pictures of some stringy discharge that it come out of her eyes and she has been concerned that this is glued.  She does awaken with her eyes matted shut with dried discharge.  She is not allergic to any medication  She does started her menstrual cycle   Past Medical History:  Diagnosis Date   Allergy    Canned Pineapples   Anxiety 2018   Obesity     Patient Active Problem List   Diagnosis Date Noted   Throbbing headache 12/06/2019   MDD (major depressive disorder), severe 12/07/2017    Past Surgical History:  Procedure Laterality Date   EYE SURGERY      OB History   No obstetric history on file.      Home Medications    Prior to Admission medications   Medication Sig Start Date End Date Taking? Authorizing Provider  gentamicin (GARAMYCIN) 0.3 % ophthalmic solution Place 2 drops into both eyes 3 (three) times daily for 5 days. 01/30/23 02/04/23 Yes Barrett Henle, MD  norgestimate-ethinyl estradiol (ORTHO-CYCLEN) 0.25-35 MG-MCG tablet Take 1 tablet by mouth daily. 06/23/22   Dorna Mai, MD    Family History Family History  Problem Relation Age of Onset   Obesity Father    Hypertension Father    Obesity Paternal Aunt    Cancer Maternal Grandmother    Lung cancer Maternal Grandfather    Migraines Neg Hx    Seizures Neg Hx    Depression Neg Hx    Anxiety disorder Neg Hx    Bipolar disorder Neg Hx    Schizophrenia Neg Hx    ADD / ADHD Neg Hx    Autism Neg Hx     Social History Social History   Tobacco Use   Smoking  status: Never    Passive exposure: Yes   Smokeless tobacco: Never  Substance Use Topics   Alcohol use: No   Drug use: No     Allergies   Other   Review of Systems Review of Systems   Physical Exam Triage Vital Signs ED Triage Vitals  Enc Vitals Group     BP 01/30/23 1028 (!) 122/91     Pulse Rate 01/30/23 1028 84     Resp 01/30/23 1028 18     Temp 01/30/23 1028 98.4 F (36.9 C)     Temp Source 01/30/23 1028 Oral     SpO2 01/30/23 1028 98 %     Weight --      Height --      Head Circumference --      Peak Flow --      Pain Score 01/30/23 1026 6     Pain Loc --      Pain Edu? --      Excl. in [place]? --    No data found.  Updated Vital Signs BP (!) 122/91 (BP Location: Left Arm)   Pulse 84   Temp 98.4 F (36.9  C) (Oral)   Resp 18   LMP 01/30/2023   SpO2 98%   Visual Acuity Right Eye Distance:   Left Eye Distance:   Bilateral Distance:    Right Eye Near:   Left Eye Near:    Bilateral Near:     Physical Exam Vitals reviewed.  Constitutional:      General: She is not in acute distress.    Appearance: She is not ill-appearing, toxic-appearing or diaphoretic.  Eyes:     Extraocular Movements: Extraocular movements intact.     Pupils: Pupils are equal, round, and reactive to light.     Comments: There is mild injection of both eyes.  Eyelids are nonswollen.  She does have a faint tan discoloration in a circular distribution just lateral to the right iris.  Skin:    Coloration: Skin is not pale.  Neurological:     Mental Status: She is alert and oriented to person, place, and time.  Psychiatric:        Behavior: Behavior normal.      UC Treatments / Results  Labs (all labs ordered are listed, but only abnormal results are displayed) Labs Reviewed - No data to display  EKG   Radiology No results found.  Procedures Procedures (including critical care time)  Medications Ordered in UC Medications - No data to display  Initial Impression /  Assessment and Plan / UC Course  I have reviewed the triage vital signs and the nursing notes.  Pertinent labs & imaging results that were available during my care of the patient were reviewed by me and considered in my medical decision making (see chart for details).        Going to treat for conjunctivitis.  She is given ophthalmology contact information to call if she is not improving. Final Clinical Impressions(s) / UC Diagnoses   Final diagnoses:  Acute conjunctivitis of both eyes, unspecified acute conjunctivitis type     Discharge Instructions      Put gentamicin eyedrops in the affected eye(s) 3 times daily for 5 days.       ED Prescriptions     Medication Sig Dispense Auth. Provider   gentamicin (GARAMYCIN) 0.3 % ophthalmic solution Place 2 drops into both eyes 3 (three) times daily for 5 days. 5 mL Barrett Henle, MD      PDMP not reviewed this encounter.   Barrett Henle, MD 01/30/23 1126

## 2023-01-30 NOTE — Discharge Instructions (Signed)
Put gentamicin eyedrops in the affected eye(s) 3 times daily for 5 days.  

## 2023-01-30 NOTE — ED Triage Notes (Signed)
Pt reports did lashes herself and when took them off must got glue in eye s because had drainage, itching irritation for a week. Tried flushing and no relief and tylenol

## 2023-01-31 ENCOUNTER — Emergency Department (HOSPITAL_COMMUNITY)
Admission: EM | Admit: 2023-01-31 | Discharge: 2023-01-31 | Disposition: A | Discharge status: Home / Self Care | Payer: Medicaid Other | Attending: Emergency Medicine | Admitting: Emergency Medicine

## 2023-01-31 ENCOUNTER — Other Ambulatory Visit: Payer: Self-pay

## 2023-01-31 ENCOUNTER — Encounter (HOSPITAL_COMMUNITY): Payer: Self-pay

## 2023-01-31 DIAGNOSIS — H1033 Unspecified acute conjunctivitis, bilateral: Secondary | ICD-10-CM

## 2023-01-31 DIAGNOSIS — H5713 Ocular pain, bilateral: Secondary | ICD-10-CM | POA: Diagnosis present

## 2023-01-31 MED ORDER — DIPHENHYDRAMINE HCL 25 MG PO CAPS
50.0000 mg | ORAL_CAPSULE | Freq: Once | ORAL | Status: AC
  Administered 2023-01-31: 50 mg via ORAL
  Filled 2023-01-31: qty 2

## 2023-01-31 MED ORDER — ERYTHROMYCIN 5 MG/GM OP OINT: TOPICAL_OINTMENT | OPHTHALMIC | 0 refills | Status: DC

## 2023-01-31 NOTE — ED Provider Notes (Signed)
[place] [facility]ERGENCY DEPARTMENT AT [facility] Provider Note   CSN: [id number] Arrival date & time: 01/31/23  1948     History  Chief Complaint  Patient presents with   Eye Drainage   Eye Problem    Carla [name] is a 22 y.o. female.  With history of allergies, anxiety, obesity presents to the ED for evaluation of bilateral eye pain.  She states she had eye pain and drainage that began 3 days ago.  She was seen and evaluated at urgent care yesterday and started on gentamicin.  She states she has had some relief, however still has significant pain and drainage.  She.  Describes the pain as a burning and itching sensation.  She denies contact lens use.  States she has been using gentamicin as prescribed.  She says she had fake eyelashes placed just prior to symptoms beginning and believes she may be allergic to the glue as it is a glue that she has never used before.  Denies history of trauma to the eye.   Eye Problem Associated symptoms: discharge, itching and redness        Home Medications Prior to Admission medications   Medication Sig Start Date End Date Taking? Authorizing Provider  erythromycin ophthalmic ointment Place a 1/2 inch ribbon of ointment into the lower eyelid of both eyes. 01/31/23  Yes Pascha Fogal, Grafton Folk, PA-C  gentamicin (GARAMYCIN) 0.3 % ophthalmic solution Place 2 drops into both eyes 3 (three) times daily for 5 days. 01/30/23 02/04/23  Barrett Henle, MD  norgestimate-ethinyl estradiol (ORTHO-CYCLEN) 0.25-35 MG-MCG tablet Take 1 tablet by mouth daily. 06/23/22   Dorna Mai, MD      Allergies    Other    Review of Systems   Review of Systems  Eyes:  Positive for pain, discharge, redness and itching.  All other systems reviewed and are negative.   Physical Exam Updated Vital Signs BP (!) 143/108   Pulse 67   Temp 98.1 F (36.7 C) (Oral)   Resp 18   Ht 5\' 7"  (1.702 m)   Wt 127 kg   LMP 01/30/2023   SpO2 99%   BMI 43.85 kg/m   Physical Exam Vitals and nursing note reviewed.  Constitutional:      General: She is not in acute distress.    Appearance: Normal appearance. She is normal weight. She is not ill-appearing.  HENT:     Head: Normocephalic and atraumatic.  Eyes:     Extraocular Movements: Extraocular movements intact.     Pupils: Pupils are equal, round, and reactive to light.     Comments: Mild conjunctival injection and purulent drainage bilaterally.  No periorbital swelling or erythema.  Pulmonary:     Effort: Pulmonary effort is normal. No respiratory distress.  Abdominal:     General: Abdomen is flat.  Musculoskeletal:        General: Normal range of motion.     Cervical back: Neck supple.  Skin:    General: Skin is warm and dry.  Neurological:     Mental Status: She is alert and oriented to person, place, and time.  Psychiatric:        Mood and Affect: Mood normal.        Behavior: Behavior normal.     ED Results / Procedures / Treatments   Labs (all labs ordered are listed, but only abnormal results are displayed) Labs Reviewed - No data to display  EKG None  Radiology No results  found.  Procedures Procedures    Medications Ordered in ED Medications  diphenhydrAMINE (BENADRYL) capsule 50 mg (50 mg Oral Given 01/31/23 2042)    ED Course/ Medical Decision Making/ A&P                             Medical Decision Making This patient presents to the ED for concern of bilateral eye pain and drainage, this involves an extensive number of treatment options.  Differential diagnosis includes allergic or bacterial conjunctivitis, periorbital cellulitis  My initial workup includes Benadryl  Additional history obtained from: Nursing notes from this visit. Previous records within EMR system urgent care visit yesterday for same  Afebrile, hemodynamically stable.  22 year old female presenting to the ED for evaluation of bilateral eye irritation and drainage.  She has been on  gentamicin for less than 1 day and states that the symptoms have not improved.  She describes it as a burning and itching sensation.  Has recently used a nail glue for her eye lashes believe this may be contributing to her symptoms.  Was given Benadryl in the ED and reported improvement but was still having some pain.  May be some component of allergic reaction to the glue, however does still have purulent drainage on exam.  Will be switched to erythromycin ointment and given contact information for ophthalmology.  She was encouraged to follow-up if she is not getting better in 2 to 3 days.  She was encouraged to perform daily eye irrigation and take Benadryl as needed.  She was given return precautions.  Stable at discharge.  At this time there does not appear to be any evidence of an acute emergency medical condition and the patient appears stable for discharge with appropriate outpatient follow up. Diagnosis was discussed with patient who verbalizes understanding of care plan and is agreeable to discharge. I have discussed return precautions with patient and sister who verbalizes understanding. Patient encouraged to follow-up with their PCP within 1 week. All questions answered.  Note: Portions of this report may have been transcribed using voice recognition software. Every effort was made to ensure accuracy; however, inadvertent computerized transcription errors may still be present.        Final Clinical Impression(s) / ED Diagnoses Final diagnoses:  Acute bacterial conjunctivitis of both eyes    Rx / DC Orders ED Discharge Orders          Ordered    erythromycin ophthalmic ointment        01/31/23 2135              Roylene Reason, Hershal Coria 01/31/23 2137    Audley Hose, MD 01/31/23 [phone]

## 2023-01-31 NOTE — Discharge Instructions (Signed)
You have been seen today for your complaint of eye pain and drainage. Your discharge medications include erythromycin ointment.  Use this in place of the gentamicin drops. Follow up with: Dr. Posey Pronto, this is an ophthalmologist.  You should call in 2 to 3 days to schedule an appointment if your symptoms are not improving Please seek immediate medical care if you develop any of the following symptoms: You have a fever and your symptoms suddenly get worse. You have severe pain when you move your eye. You have facial pain, redness, or swelling. You have a sudden loss of vision. At this time there does not appear to be the presence of an emergent medical condition, however there is always the potential for conditions to change. Please read and follow the below instructions.  Do not take your medicine if  develop an itchy rash, swelling in your mouth or lips, or difficulty breathing; call 911 and seek immediate emergency medical attention if this occurs.  You may review your lab tests and imaging results in their entirety on your MyChart account.  Please discuss all results of fully with your primary care provider and other specialist at your follow-up visit.  Note: Portions of this text may have been transcribed using voice recognition software. Every effort was made to ensure accuracy; however, inadvertent computerized transcription errors may still be present.

## 2023-01-31 NOTE — ED Triage Notes (Signed)
Pt has had bilateral eye pain, green drainage, blurry vision, and swelling for over a week. Seen at [facility] yesterday and given gentamicin drops for eyes. Pt has headache and pain started to get better, but is worse again.

## 2023-02-27 ENCOUNTER — Encounter (HOSPITAL_COMMUNITY): Payer: Self-pay

## 2023-02-27 ENCOUNTER — Emergency Department (HOSPITAL_COMMUNITY)
Admission: EM | Admit: 2023-02-27 | Discharge: 2023-02-27 | Disposition: A | Discharge status: Home / Self Care | Payer: Medicaid Other | Attending: Emergency Medicine | Admitting: Emergency Medicine

## 2023-02-27 DIAGNOSIS — T7840XA Allergy, unspecified, initial encounter: Secondary | ICD-10-CM

## 2023-02-27 DIAGNOSIS — R21 Rash and other nonspecific skin eruption: Secondary | ICD-10-CM | POA: Insufficient documentation

## 2023-02-27 MED ORDER — TRIAMCINOLONE ACETONIDE 0.1 % EX CREA: 1.0000 | TOPICAL_CREAM | Freq: Two times a day (BID) | CUTANEOUS | 0 refills | Status: DC

## 2023-02-27 MED ORDER — DEXAMETHASONE SODIUM PHOSPHATE 10 MG/ML IJ SOLN
10.0000 mg | Freq: Once | INTRAMUSCULAR | Status: AC
  Administered 2023-02-27: 10 mg via INTRAMUSCULAR
  Filled 2023-02-27: qty 1

## 2023-02-27 MED ORDER — DIPHENHYDRAMINE-ZINC ACETATE 2-0.1 % EX CREA: 1.0000 | TOPICAL_CREAM | Freq: Three times a day (TID) | CUTANEOUS | 0 refills | Status: DC | PRN

## 2023-02-27 NOTE — ED Provider Notes (Signed)
[place] EMERGENCY DEPARTMENT AT [facility] Provider Note   CSN: [id number] Arrival date & time: 02/27/23  1334     History  Chief Complaint  Patient presents with   Allergic Reaction    Carla [name] is a 22 y.o. female.   Allergic Reaction    Patient presents to the emergency department due to rash.  The rash is on her inner thighs, she tried a new soap product from [employer] and tried it on her thighs, she broke out in hives and it has been pruritic.  Has not spread elsewhere, she is not having difficulty breathing, shortness of breath, facial swelling.  No history of the same.  Tried Benadryl for the itching which helped a little bit made her tired.  Home Medications Prior to Admission medications   Medication Sig Start Date End Date Taking? Authorizing Provider  erythromycin ophthalmic ointment Place a 1/2 inch ribbon of ointment into the lower eyelid of both eyes. 01/31/23   Schutt, Edsel Petrin, PA-C  norgestimate-ethinyl estradiol (ORTHO-CYCLEN) 0.25-35 MG-MCG tablet Take 1 tablet by mouth daily. 06/23/22   [name], MD      Allergies    Other    Review of Systems   Review of Systems  Physical Exam Updated Vital Signs BP (!) 152/112 (BP Location: Left Arm)   Pulse 79   Temp 97.9 F (36.6 C) (Oral)   Resp 18   LMP 01/30/2023   SpO2 99%  Physical Exam Vitals and nursing note reviewed. Exam conducted with a chaperone present.  Constitutional:      Appearance: Normal appearance.  HENT:     Head: Normocephalic and atraumatic.     Mouth/Throat:     Comments: No angioedema or periorbital swelling Eyes:     General: No scleral icterus.       Right eye: No discharge.        Left eye: No discharge.     Extraocular Movements: Extraocular movements intact.     Pupils: Pupils are equal, round, and reactive to light.  Cardiovascular:     Rate and Rhythm: Normal rate and regular rhythm.     Pulses: Normal pulses.     Heart sounds: Normal heart  sounds.     No friction rub. No gallop.  Pulmonary:     Effort: Pulmonary effort is normal. No respiratory distress.     Breath sounds: Normal breath sounds.     Comments: No stridor Abdominal:     General: Abdomen is flat. Bowel sounds are normal. There is no distension.     Palpations: Abdomen is soft.     Tenderness: There is no abdominal tenderness.  Skin:    General: Skin is warm and dry.     Coloration: Skin is not jaundiced.     Findings: Rash present.     Comments: Hives in her thighs bilaterally  Neurological:     Mental Status: She is alert. Mental status is at baseline.     Coordination: Coordination normal.     ED Results / Procedures / Treatments   Labs (all labs ordered are listed, but only abnormal results are displayed) Labs Reviewed - No data to display  EKG None  Radiology No results found.  Procedures Procedures    Medications Ordered in ED Medications - No data to display  ED Course/ Medical Decision Making/ A&P  Medical Decision Making Risk OTC drugs. Prescription drug management.   Patient presents due to rash.  She has localized skin reaction/hives.  Not consistent with TN, SJS or dress syndrome.  Clinically no SIRS criteria not septic.  There is no systemic symptoms and patient is not in anaphylaxis.  Recommended Benadryl lotion, topical triamcinolone cream and outpatient follow-up as needed.  Stable for discharge at this time.        Final Clinical Impression(s) / ED Diagnoses Final diagnoses:  None    Rx / DC Orders ED Discharge Orders     None         Theron Arista, Cordelia Poche 02/27/23 Vertell Limber, MD 02/28/23 [phone]

## 2023-02-27 NOTE — ED Triage Notes (Signed)
Pt bought a new soap from [employer] meant to clean privates. Pt tested the soap on her right thigh and now has hives.

## 2023-02-27 NOTE — Discharge Instructions (Signed)
You are seen today in the emergency department due to rash.  Use the triamcinolone cream twice daily, this is a steroid that will help with the rash, do not use for more than a week as it can also cause skin lightening.  Use the Benadryl cream up to 3 times daily for itching.  Shot of steroids given each in the ED will stay in her system for the next 2 days to help reduce inflammation.  Return to ED if you have any difficulty breathing, facial swelling, worsening hives or new or concerning symptoms.

## 2023-04-20 ENCOUNTER — Inpatient Hospital Stay: Admission: RE | Admit: 2023-04-20 | Payer: Medicaid Other | Source: Ambulatory Visit

## 2023-04-20 ENCOUNTER — Telehealth: Payer: Self-pay | Admitting: Family Medicine

## 2023-04-20 ENCOUNTER — Telehealth: Payer: Self-pay

## 2023-04-20 NOTE — Telephone Encounter (Signed)
Called pt to schedule appt per MyChart appt request, saw that TB test already scheduled by [facility]. Left vm to call office back if pt still interested in booking Physical appt.

## 2023-04-21 ENCOUNTER — Ambulatory Visit: Payer: Medicaid Other

## 2023-04-21 DIAGNOSIS — Z111 Encounter for screening for respiratory tuberculosis: Secondary | ICD-10-CM

## 2023-04-21 NOTE — Progress Notes (Unsigned)
Patient came in for PPD screening  for her job

## 2023-04-24 NOTE — Addendum Note (Signed)
Addended by: Kieth Brightly on: 04/24/2023 04:27 PM   Modules accepted: Orders

## 2023-04-25 ENCOUNTER — Telehealth: Payer: Self-pay | Admitting: Family Medicine

## 2023-04-25 NOTE — Telephone Encounter (Signed)
Pt called In about tb testing , while speaking with nurse pt hung up on [facility], called patient back to inform her that she will need to back in and get another tb test because she didn't come in on Monday to get it read. Couldn't leave messge and phone said this number couldn't be completed as dialed.

## 2023-04-26 ENCOUNTER — Ambulatory Visit (INDEPENDENT_AMBULATORY_CARE_PROVIDER_SITE_OTHER): Payer: Medicaid Other

## 2023-04-26 DIAGNOSIS — Z111 Encounter for screening for respiratory tuberculosis: Secondary | ICD-10-CM

## 2023-04-26 NOTE — Progress Notes (Signed)
Pt here for TB skin test  Advised pt to return in 48-72 hours for read, pt verbalized understanding   Injection was given with no complications at this time and no further questions per pt

## 2023-04-28 ENCOUNTER — Ambulatory Visit: Payer: Medicaid Other

## 2023-04-28 NOTE — Progress Notes (Signed)
Pt came in for TB skin test read  Results negative, paperwork filled out for pt

## 2023-05-09 ENCOUNTER — Encounter: Payer: Self-pay | Admitting: *Deleted

## 2023-05-09 ENCOUNTER — Telehealth: Payer: Self-pay | Admitting: Family Medicine

## 2023-05-09 NOTE — Telephone Encounter (Signed)
Copied from CRM [id number]. Topic: General - Other >> May 09, 2023 10:02 AM Epimenio Foot F wrote: Reason for CRM: Pt is calling in because she needs Dr. Andrey Campanile to sign the paperwork from her physical so she can begin a new job. Pt says it is okay to send it Via MyChart.

## 2023-05-12 ENCOUNTER — Encounter (HOSPITAL_COMMUNITY): Payer: Self-pay

## 2023-05-12 ENCOUNTER — Ambulatory Visit (HOSPITAL_COMMUNITY)
Admission: EM | Admit: 2023-05-12 | Discharge: 2023-05-12 | Disposition: A | Discharge status: Home / Self Care | Payer: MEDICAID | Attending: Physician Assistant | Admitting: Physician Assistant

## 2023-05-12 DIAGNOSIS — N760 Acute vaginitis: Secondary | ICD-10-CM | POA: Diagnosis present

## 2023-05-12 DIAGNOSIS — Z113 Encounter for screening for infections with a predominantly sexual mode of transmission: Secondary | ICD-10-CM | POA: Diagnosis not present

## 2023-05-12 MED ORDER — METRONIDAZOLE 500 MG PO TABS: 500.0000 mg | ORAL_TABLET | Freq: Two times a day (BID) | ORAL | 0 refills | Status: DC

## 2023-05-12 NOTE — ED Provider Notes (Signed)
[facility]    CSN: [id number] Arrival date & time: 05/12/23  1834      History   Chief Complaint No chief complaint on file.   HPI Carla [name] is a 22 y.o. female.   Pt complains of increased vaginal discharge with a foul odor that started one day ago.  Reports thin watery discharge.  Denies pelvic pain, abdominal pain, fever, chills, dysuria.  She reports a new sexual partner, unprotected sex two days ago.  Period to start in 1-2 days.      Past Medical History:  Diagnosis Date   Allergy    Canned Pineapples   Anxiety 2018   Obesity     Patient Active Problem List   Diagnosis Date Noted   Throbbing headache 12/06/2019   MDD (major depressive disorder), severe (HCC) 12/07/2017    Past Surgical History:  Procedure Laterality Date   EYE SURGERY      OB History   No obstetric history on file.      Home Medications    Prior to Admission medications   Medication Sig Start Date End Date Taking? Authorizing Provider  metroNIDAZOLE (FLAGYL) 500 MG tablet Take 1 tablet (500 mg total) by mouth 2 (two) times daily. 05/12/23  Yes Ward, Tylene Fantasia, PA-C  diphenhydrAMINE-zinc acetate (BENADRYL EXTRA STRENGTH) cream Apply 1 Application topically 3 (three) times daily as needed for itching. 02/27/23   Theron Arista, PA-C  erythromycin ophthalmic ointment Place a 1/2 inch ribbon of ointment into the lower eyelid of both eyes. 01/31/23   Schutt, Edsel Petrin, PA-C  norgestimate-ethinyl estradiol (ORTHO-CYCLEN) 0.25-35 MG-MCG tablet Take 1 tablet by mouth daily. 06/23/22   [name], MD  triamcinolone cream (KENALOG) 0.1 % Apply 1 Application topically 2 (two) times daily. 02/27/23   Theron Arista, PA-C    Family History Family History  Problem Relation Age of Onset   Obesity Father    Hypertension Father    Obesity Paternal Aunt    Cancer Maternal Grandmother    Lung cancer Maternal Grandfather    Migraines Neg Hx    Seizures Neg Hx    Depression Neg Hx     Anxiety disorder Neg Hx    Bipolar disorder Neg Hx    Schizophrenia Neg Hx    ADD / ADHD Neg Hx    Autism Neg Hx     Social History Social History   Tobacco Use   Smoking status: Never    Passive exposure: Yes   Smokeless tobacco: Never  Substance Use Topics   Alcohol use: No   Drug use: No     Allergies   Other   Review of Systems Review of Systems  Constitutional:  Negative for chills and fever.  HENT:  Negative for ear pain and sore throat.   Eyes:  Negative for pain and visual disturbance.  Respiratory:  Negative for cough and shortness of breath.   Cardiovascular:  Negative for chest pain and palpitations.  Gastrointestinal:  Negative for abdominal pain and vomiting.  Genitourinary:  Positive for vaginal discharge. Negative for dysuria and hematuria.  Musculoskeletal:  Negative for arthralgias and back pain.  Skin:  Negative for color change and rash.  Neurological:  Negative for seizures and syncope.  All other systems reviewed and are negative.    Physical Exam Triage Vital Signs ED Triage Vitals [05/12/23 1856]  Encounter Vitals Group     BP (!) 145/103     Systolic BP Percentile  Diastolic BP Percentile      Pulse Rate 79     Resp 16     Temp 98.3 F (36.8 C)     Temp Source Oral     SpO2 98 %     Weight      Height      Head Circumference      Peak Flow      Pain Score      Pain Loc      Pain Education      Exclude from Growth Chart    No data found.  Updated Vital Signs BP (!) 145/103 (BP Location: Left Arm)   Pulse 79   Temp 98.3 F (36.8 C) (Oral)   Resp 16   LMP 04/12/2023   SpO2 98%   Visual Acuity Right Eye Distance:   Left Eye Distance:   Bilateral Distance:    Right Eye Near:   Left Eye Near:    Bilateral Near:     Physical Exam Vitals and nursing note reviewed.  Constitutional:      General: She is not in acute distress.    Appearance: She is well-developed.  HENT:     Head: Normocephalic and atraumatic.   Eyes:     Conjunctiva/sclera: Conjunctivae normal.  Cardiovascular:     Rate and Rhythm: Normal rate and regular rhythm.     Heart sounds: No murmur heard. Pulmonary:     Effort: Pulmonary effort is normal. No respiratory distress.     Breath sounds: Normal breath sounds.  Abdominal:     Palpations: Abdomen is soft.     Tenderness: There is no abdominal tenderness.  Musculoskeletal:        General: No swelling.     Cervical back: Neck supple.  Skin:    General: Skin is warm and dry.     Capillary Refill: Capillary refill takes less than 2 seconds.  Neurological:     Mental Status: She is alert.  Psychiatric:        Mood and Affect: Mood normal.      UC Treatments / Results  Labs (all labs ordered are listed, but only abnormal results are displayed) Labs Reviewed  CERVICOVAGINAL ANCILLARY ONLY    EKG   Radiology No results found.  Procedures Procedures (including critical care time)  Medications Ordered in UC Medications - No data to display  Initial Impression / Assessment and Plan / UC Course  I have reviewed the triage vital signs and the nursing notes.  Pertinent labs & imaging results that were available during my care of the patient were reviewed by me and considered in my medical decision making (see chart for details).     Will treat for BV given sx.  Cervicovaginal self swab in clinic today.  Will change treated plan if indicated based on results.   Final Clinical Impressions(s) / UC Diagnoses   Final diagnoses:  Vaginosis  Screen for STD (sexually transmitted disease)     Discharge Instructions      Take flagyl as prescribed Will call lab results and change treatment plan if indicated   ED Prescriptions     Medication Sig Dispense Auth. Provider   metroNIDAZOLE (FLAGYL) 500 MG tablet Take 1 tablet (500 mg total) by mouth 2 (two) times daily. 14 tablet Ward, Tylene Fantasia, PA-C      PDMP not reviewed this encounter.   Ward, Tylene Fantasia,  PA-C 05/12/23 1921

## 2023-05-12 NOTE — ED Triage Notes (Signed)
Here for vaginal discharge and odor x 3 days.

## 2023-05-12 NOTE — Discharge Instructions (Signed)
Take flagyl as prescribed Will call lab results and change treatment plan if indicated

## 2023-05-15 LAB — CERVICOVAGINAL ANCILLARY ONLY
Bacterial Vaginitis (gardnerella): POSITIVE — AB
Candida Glabrata: NEGATIVE
Candida Vaginitis: NEGATIVE
Chlamydia: NEGATIVE
Comment: NEGATIVE
Comment: NEGATIVE
Comment: NEGATIVE
Comment: NEGATIVE
Comment: NEGATIVE
Comment: NORMAL
Neisseria Gonorrhea: NEGATIVE
Trichomonas: POSITIVE — AB

## 2023-08-13 ENCOUNTER — Encounter (HOSPITAL_COMMUNITY): Payer: Self-pay | Admitting: Pharmacy Technician

## 2023-08-13 ENCOUNTER — Emergency Department (HOSPITAL_COMMUNITY)
Admission: EM | Admit: 2023-08-13 | Discharge: 2023-08-13 | Disposition: A | Discharge status: Home / Self Care | Payer: MEDICAID | Attending: Emergency Medicine | Admitting: Emergency Medicine

## 2023-08-13 ENCOUNTER — Other Ambulatory Visit: Payer: Self-pay

## 2023-08-13 DIAGNOSIS — Z79899 Other long term (current) drug therapy: Secondary | ICD-10-CM | POA: Diagnosis not present

## 2023-08-13 DIAGNOSIS — I10 Essential (primary) hypertension: Secondary | ICD-10-CM | POA: Diagnosis not present

## 2023-08-13 DIAGNOSIS — B3731 Acute candidiasis of vulva and vagina: Secondary | ICD-10-CM | POA: Insufficient documentation

## 2023-08-13 DIAGNOSIS — Z202 Contact with and (suspected) exposure to infections with a predominantly sexual mode of transmission: Secondary | ICD-10-CM | POA: Insufficient documentation

## 2023-08-13 DIAGNOSIS — N898 Other specified noninflammatory disorders of vagina: Secondary | ICD-10-CM | POA: Diagnosis present

## 2023-08-13 DIAGNOSIS — Z711 Person with feared health complaint in whom no diagnosis is made: Secondary | ICD-10-CM

## 2023-08-13 LAB — WET PREP, GENITAL
Clue Cells Wet Prep HPF POC: NONE SEEN
Sperm: NONE SEEN
Trich, Wet Prep: NONE SEEN
WBC, Wet Prep HPF POC: >=10 — AB (ref <10)

## 2023-08-13 MED ORDER — DOXYCYCLINE HYCLATE 100 MG PO TABS
100.0000 mg | ORAL_TABLET | Freq: Once | ORAL | Status: AC
  Administered 2023-08-13: 100 mg via ORAL
  Filled 2023-08-13: qty 1

## 2023-08-13 MED ORDER — FLUCONAZOLE 150 MG PO TABS
150.0000 mg | ORAL_TABLET | Freq: Once | ORAL | Status: AC
  Administered 2023-08-13: 150 mg via ORAL
  Filled 2023-08-13: qty 1

## 2023-08-13 MED ORDER — CEFTRIAXONE SODIUM 1 G IJ SOLR
500.0000 mg | Freq: Once | INTRAMUSCULAR | Status: AC
  Administered 2023-08-13: 500 mg via INTRAMUSCULAR
  Filled 2023-08-13: qty 10

## 2023-08-13 MED ORDER — DOXYCYCLINE HYCLATE 100 MG PO CAPS: 100.0000 mg | ORAL_CAPSULE | Freq: Two times a day (BID) | ORAL | 0 refills | Status: DC

## 2023-08-13 MED ORDER — STERILE WATER FOR INJECTION IJ SOLN
INTRAMUSCULAR | Status: AC
  Administered 2023-08-13: 10 mL
  Filled 2023-08-13: qty 10

## 2023-08-13 MED ORDER — AMLODIPINE BESYLATE 5 MG PO TABS: 5.0000 mg | ORAL_TABLET | Freq: Every day | ORAL | 1 refills | Status: AC

## 2023-08-13 NOTE — ED Triage Notes (Signed)
Pt here with reports of possible STI. States malodorous vaginal discharge, yellowish in color and watery. Also states itching.

## 2023-08-13 NOTE — ED Provider Notes (Signed)
[place] EMERGENCY DEPARTMENT AT [facility] Provider Note   CSN: [id number] Arrival date & time: 08/13/23  1045     History  Chief Complaint  Patient presents with   Vaginal Discharge    Carla [name] is a 22 y.o. female with medical history of anxiety, obesity, MDD.  Patient presents to ED for evaluation of STI testing.  Patient reports that 5 days ago she had unprotected sex with new partner.  States that 2 days ago started having malodorous vaginal discharge.  Reports that she has had STIs in the past and this is very similar presentation.  She states that she is "positive" that she has an STI.  She denies dysuria, nausea, vomiting, fevers or abdominal pain.  Of note, patient blood pressure elevated here at 174/127.  She reports she has a history of hypertension in her family but denies any diagnosis of hypertension for self-refer taking blood pressure medication.  Denies chest pain, shortness of breath, headache or blurred vision.   Vaginal Discharge Associated symptoms: no abdominal pain, no dysuria, no fever, no nausea and no vomiting        Home Medications Prior to Admission medications   Medication Sig Start Date End Date Taking? Authorizing Provider  amLODipine (NORVASC) 5 MG tablet Take 1 tablet (5 mg total) by mouth daily. 08/13/23  Yes Al Decant, PA-C  doxycycline (VIBRAMYCIN) 100 MG capsule Take 1 capsule (100 mg total) by mouth 2 (two) times daily. 08/13/23  Yes Al Decant, PA-C  diphenhydrAMINE-zinc acetate (BENADRYL EXTRA STRENGTH) cream Apply 1 Application topically 3 (three) times daily as needed for itching. 02/27/23   Theron Arista, PA-C  erythromycin ophthalmic ointment Place a 1/2 inch ribbon of ointment into the lower eyelid of both eyes. 01/31/23   Schutt, Edsel Petrin, PA-C  metroNIDAZOLE (FLAGYL) 500 MG tablet Take 1 tablet (500 mg total) by mouth 2 (two) times daily. 05/12/23   Ward, Tylene Fantasia, PA-C  norgestimate-ethinyl  estradiol (ORTHO-CYCLEN) 0.25-35 MG-MCG tablet Take 1 tablet by mouth daily. 06/23/22   [name], MD  triamcinolone cream (KENALOG) 0.1 % Apply 1 Application topically 2 (two) times daily. 02/27/23   Theron Arista, PA-C      Allergies    Other    Review of Systems   Review of Systems  Constitutional:  Negative for fever.  Eyes:  Negative for visual disturbance.  Respiratory:  Negative for shortness of breath.   Cardiovascular:  Negative for chest pain.  Gastrointestinal:  Negative for abdominal pain, nausea and vomiting.  Genitourinary:  Positive for vaginal discharge. Negative for dysuria.  Neurological:  Negative for headaches.  All other systems reviewed and are negative.   Physical Exam Updated Vital Signs BP (!) 174/127 (BP Location: Right Arm)   Pulse 86   Temp 98.8 F (37.1 C) (Oral)   Resp 18   LMP 07/31/2023   SpO2 100%  Physical Exam Vitals and nursing note reviewed.  Constitutional:      General: She is not in acute distress.    Appearance: Normal appearance. She is not ill-appearing, toxic-appearing or diaphoretic.  HENT:     Head: Normocephalic and atraumatic.     Nose: Nose normal.     Mouth/Throat:     Mouth: Mucous membranes are moist.     Pharynx: Oropharynx is clear.  Eyes:     Extraocular Movements: Extraocular movements intact.     Conjunctiva/sclera: Conjunctivae normal.     Pupils: Pupils are equal, round, and  reactive to light.  Cardiovascular:     Rate and Rhythm: Normal rate and regular rhythm.  Pulmonary:     Effort: Pulmonary effort is normal.     Breath sounds: Normal breath sounds. No wheezing.  Abdominal:     General: Abdomen is flat. Bowel sounds are normal.     Palpations: Abdomen is soft.     Tenderness: There is no abdominal tenderness.  Genitourinary:    Comments: Patient declined GU exam Musculoskeletal:     Cervical back: Normal range of motion and neck supple. No tenderness.  Skin:    General: Skin is warm and dry.      Capillary Refill: Capillary refill takes less than 2 seconds.  Neurological:     General: No focal deficit present.     Mental Status: She is alert and oriented to person, place, and time.     ED Results / Procedures / Treatments   Labs (all labs ordered are listed, but only abnormal results are displayed) Labs Reviewed  WET PREP, GENITAL - Abnormal; Notable for the following components:      Result Value   Yeast Wet Prep HPF POC PRESENT (*)    WBC, Wet Prep HPF POC >=10 (*)    All other components within normal limits  GC/CHLAMYDIA PROBE AMP ([place]) NOT AT [facility]    EKG None  Radiology No results found.  Procedures Procedures   Medications Ordered in ED Medications  cefTRIAXone (ROCEPHIN) injection 500 mg (500 mg Intramuscular Given 08/13/23 1128)  doxycycline (VIBRA-TABS) tablet 100 mg (100 mg Oral Given 08/13/23 1128)  sterile water (preservative free) injection (10 mLs  Given 08/13/23 1130)  fluconazole (DIFLUCAN) tablet 150 mg (150 mg Oral Given 08/13/23 1237)    ED Course/ Medical Decision Making/ A&P  Medical Decision Making Amount and/or Complexity of Data Reviewed Labs: ordered.  Risk Prescription drug management.   22 year old female presents to ED for evaluation.  Please see HPI for further details.  On exam patient is afebrile and nontachycardic.  Overall nontoxic appearance.  No abdominal tenderness.  She declined a GU examination.  Wet prep does reveal yeast cells so treated with 150 mg fluconazole.  GC/chlamydia screening sent out, treated with 500 mg ceftriaxone IM, initial dose of doxycycline.  Patient will be sent home with 7 days of doxycycline twice daily.  Will also start patient on low-dose amlodipine for hypertension and have her follow-up with PCP as well as record blood pressure log.  Patient advised to return to ED with any new or worsening symptoms.  Stable to discharge.   Final Clinical Impression(s) / ED Diagnoses Final diagnoses:   Yeast vaginitis  Concern about STD in female without diagnosis    Rx / DC Orders ED Discharge Orders          Ordered    amLODipine (NORVASC) 5 MG tablet  Daily        08/13/23 1259    doxycycline (VIBRAMYCIN) 100 MG capsule  2 times daily        08/13/23 1259              Al Decant, [place] 08/13/23 1300    Margarita Grizzle, MD 08/13/23 1525

## 2023-08-13 NOTE — Discharge Instructions (Addendum)
It was a pleasure taking part in your care today.  As we discussed, it appears that you have a yeast infection.  This was treated with 150 mg of fluconazole.  Your GC/chlamydia screening has been sent out, please begin taking doxycycline twice daily for the next 7 days.  Please follow-up on the results of your testing done here today on MyChart.  Please also begin taking log of your blood pressure and begin taking 5 mg amlodipine once daily.  Please follow-up with your PCP for further management and care of your high blood pressure.  Please return to the ED with any new or worsening symptoms.

## 2023-08-14 LAB — GC/CHLAMYDIA PROBE AMP (~~LOC~~) NOT AT ARMC
Chlamydia: NEGATIVE
Comment: NEGATIVE
Comment: NORMAL
Neisseria Gonorrhea: NEGATIVE

## 2023-08-17 ENCOUNTER — Ambulatory Visit (INDEPENDENT_AMBULATORY_CARE_PROVIDER_SITE_OTHER): Payer: MEDICAID

## 2023-08-17 DIAGNOSIS — Z1159 Encounter for screening for other viral diseases: Secondary | ICD-10-CM

## 2023-08-17 DIAGNOSIS — Z23 Encounter for immunization: Secondary | ICD-10-CM

## 2023-08-18 LAB — HEPATITIS C ANTIBODY: Hep C Virus Ab: NONREACTIVE

## 2023-09-15 ENCOUNTER — Ambulatory Visit (HOSPITAL_COMMUNITY)
Admission: EM | Admit: 2023-09-15 | Discharge: 2023-09-15 | Disposition: A | Discharge status: Home / Self Care | Payer: MEDICAID | Attending: Emergency Medicine | Admitting: Emergency Medicine

## 2023-09-15 ENCOUNTER — Encounter (HOSPITAL_COMMUNITY): Payer: Self-pay | Admitting: Emergency Medicine

## 2023-09-15 DIAGNOSIS — Z91038 Other insect allergy status: Secondary | ICD-10-CM

## 2023-09-15 DIAGNOSIS — T7840XA Allergy, unspecified, initial encounter: Secondary | ICD-10-CM | POA: Diagnosis not present

## 2023-09-15 DIAGNOSIS — Z3202 Encounter for pregnancy test, result negative: Secondary | ICD-10-CM | POA: Diagnosis not present

## 2023-09-15 DIAGNOSIS — W57XXXA Bitten or stung by nonvenomous insect and other nonvenomous arthropods, initial encounter: Secondary | ICD-10-CM

## 2023-09-15 LAB — POCT URINE PREGNANCY: Preg Test, Ur: NEGATIVE

## 2023-09-15 MED ORDER — PREDNISONE 20 MG PO TABS
ORAL_TABLET | ORAL | Status: AC
  Filled 2023-09-15: qty 3

## 2023-09-15 MED ORDER — EPINEPHRINE 0.3 MG/0.3ML IJ SOAJ: 0.3000 mg | Freq: Once | INTRAMUSCULAR | 0 refills | Status: AC

## 2023-09-15 MED ORDER — DIPHENHYDRAMINE HCL 25 MG PO CAPS
50.0000 mg | ORAL_CAPSULE | Freq: Once | ORAL | Status: AC
  Administered 2023-09-15: 50 mg via ORAL

## 2023-09-15 MED ORDER — PREDNISONE 10 MG (21) PO TBPK: ORAL_TABLET | ORAL | 0 refills | Status: DC

## 2023-09-15 MED ORDER — FAMOTIDINE 20 MG PO TABS
ORAL_TABLET | ORAL | Status: AC
  Filled 2023-09-15: qty 2

## 2023-09-15 MED ORDER — PREDNISONE 20 MG PO TABS
60.0000 mg | ORAL_TABLET | Freq: Once | ORAL | Status: AC
  Administered 2023-09-15: 60 mg via ORAL

## 2023-09-15 MED ORDER — DIPHENHYDRAMINE HCL 25 MG PO CAPS
ORAL_CAPSULE | ORAL | Status: AC
  Filled 2023-09-15: qty 2

## 2023-09-15 MED ORDER — LORATADINE 10 MG PO TABS: 10.0000 mg | ORAL_TABLET | Freq: Every day | ORAL | 0 refills | Status: DC

## 2023-09-15 MED ORDER — FAMOTIDINE 20 MG PO TABS: 20.0000 mg | ORAL_TABLET | Freq: Two times a day (BID) | ORAL | 0 refills | Status: DC

## 2023-09-15 MED ORDER — FAMOTIDINE 20 MG PO TABS
40.0000 mg | ORAL_TABLET | Freq: Once | ORAL | Status: AC
  Administered 2023-09-15: 40 mg via ORAL

## 2023-09-15 NOTE — ED Triage Notes (Signed)
Pt presents with swelling of left arm and right leg. States she woke up this morning with welts and they have gotten very swollen and itchy.

## 2023-09-15 NOTE — Discharge Instructions (Signed)
Check your mattress for bedbugs when you get home.  Start the prednisone, Pepcid and Claritin tomorrow as you have taken today's dose.  Finish his medications, even if you feel better.  Have the pharmacist show you how to use the EpiPen.  Use the EpiPen for the signs and symptoms we discussed and go immediately to the emergency department.

## 2023-09-15 NOTE — ED Provider Notes (Signed)
HPI  SUBJECTIVE:  Carla [name] is a 22 y.o. female who presents with painful, intensely pruritic, swollen areas on her forearms and legs.  States she woke up with multiple bites this morning, and that the redness, pain and itching is spreading rapidly.  Her left forearm is now swollen.  She states that the rash is not spreading.  She denies tongue or lip swelling, throat swelling shut, throat itching, voice changes, coughing, wheezing, shortness of breath, nausea, vomiting, abdominal pain, lightheadedness, syncope.  She reports 3 episodes of diarrhea today, but attributes this to the [employer] that she ate the night before.  She states that she always has reactions like this when she gets "bitten by something".  She denies history of anaphylaxis, MRSA infections.  No contacts with similar rash.  No pets in the house.  Did not notice any blood on the bed clothes, denies sensation of being bitten at night.  LMP: October 28.  Not sure if she could be pregnant.  PCP: [facility].   Past Medical History:  Diagnosis Date   Allergy    Canned Pineapples   Anxiety 2018   Obesity     Past Surgical History:  Procedure Laterality Date   EYE SURGERY      Family History  Problem Relation Age of Onset   Obesity Father    Hypertension Father    Obesity Paternal Aunt    Cancer Maternal Grandmother    Lung cancer Maternal Grandfather    Migraines Neg Hx    Seizures Neg Hx    Depression Neg Hx    Anxiety disorder Neg Hx    Bipolar disorder Neg Hx    Schizophrenia Neg Hx    ADD / ADHD Neg Hx    Autism Neg Hx     Social History   Tobacco Use   Smoking status: Never    Passive exposure: Yes   Smokeless tobacco: Never  Substance Use Topics   Alcohol use: No   Drug use: No    No current facility-administered medications for this encounter.  Current Outpatient Medications:    EPINEPHrine 0.3 mg/0.3 mL IJ SOAJ injection, Inject 0.3 mg into the muscle once for 1 dose., Disp: 0.3 mL,  Rfl: 0   famotidine (PEPCID) 20 MG tablet, Take 1 tablet (20 mg total) by mouth 2 (two) times daily., Disp: 10 tablet, Rfl: 0   loratadine (CLARITIN) 10 MG tablet, Take 1 tablet (10 mg total) by mouth daily., Disp: 10 tablet, Rfl: 0   predniSONE (STERAPRED UNI-PAK 21 TAB) 10 MG (21) TBPK tablet, Dispense one 6 day pack. Take as directed with food., Disp: 21 tablet, Rfl: 0   amLODipine (NORVASC) 5 MG tablet, Take 1 tablet (5 mg total) by mouth daily., Disp: 30 tablet, Rfl: 1   diphenhydrAMINE-zinc acetate (BENADRYL EXTRA STRENGTH) cream, Apply 1 Application topically 3 (three) times daily as needed for itching., Disp: 28.4 g, Rfl: 0   erythromycin ophthalmic ointment, Place a 1/2 inch ribbon of ointment into the lower eyelid of both eyes., Disp: 3.5 g, Rfl: 0   norgestimate-ethinyl estradiol (ORTHO-CYCLEN) 0.25-35 MG-MCG tablet, Take 1 tablet by mouth daily., Disp: 84 tablet, Rfl: 0   triamcinolone cream (KENALOG) 0.1 %, Apply 1 Application topically 2 (two) times daily., Disp: 30 g, Rfl: 0  Allergies  Allergen Reactions   Other Hives    "Canned pineapples"     ROS  As noted in HPI.   Physical Exam  BP (!) 128/100 (BP  Location: Right Arm)   Pulse 89   Temp 98.7 F (37.1 C) (Oral)   Resp 17   LMP 08/28/2023 (Exact Date)   SpO2 100%   Constitutional: Well developed, well nourished, no acute distress Eyes:  EOMI, conjunctiva normal bilaterally HENT: Normocephalic, atraumatic,mucus membranes moist.  No angioedema of the lips or tongue.  Airway widely patent.  Normal voice. Respiratory: Normal inspiratory effort, lungs clear bilaterally. Cardiovascular: Normal rate, regular rhythm, no murmurs rubs or gallops GI: nondistended skin: Tender, blanchable, erythematous swollen lesions that appear to be insect bites on left forearm, leg.  Left forearm swollen.            Musculoskeletal: no deformities Neurologic: Alert & oriented x 3, no focal neuro deficits Psychiatric: Speech  and behavior appropriate   ED Course   Medications  predniSONE (DELTASONE) tablet 60 mg (60 mg Oral Given 09/15/23 1506)  famotidine (PEPCID) tablet 40 mg (40 mg Oral Given 09/15/23 1503)  diphenhydrAMINE (BENADRYL) capsule 50 mg (50 mg Oral Given 09/15/23 1502)    Orders Placed This Encounter  Procedures   POCT urine pregnancy    Standing Status:   Standing    Number of Occurrences:   1    Results for orders placed or performed during the hospital encounter of 09/15/23 (from the past 24 hour(s))  POCT urine pregnancy     Status: None   Collection Time: 09/15/23  3:12 PM  Result Value Ref Range   Preg Test, Ur Negative Negative   No results found.  ED Clinical Impression  1. Allergic reaction, initial encounter   2. Allergic reaction to insect bite   3. Multiple insect bites      ED Assessment/Plan   {The patient has been seen in Urgent Care in the last 3 years. :1}  Patient was seen in triage.  She has no evidence of anaphylaxis at this time.  She states that she always has reactions like this when she gets bitten by something.  She reports 3 episodes of diarrhea today, but attributes this to the [employer] chicken sandwich that she ate last night.  She states that she is lactose intolerant.  She denies lightheadedness, dizziness, angioedema, sensation of throat closing, throat itching, wheezing, shortness of breath, nausea, vomiting, abdominal pain.  No syncope.  Checking urine pregnancy Pregnancy negative. 60 mg of prednisone, 40 mg of Pepcid and 50 mg of Benadryl.  Will reevaluate.  1620-on reevaluation, patient states she feels better.  The erythema, left forearm swelling is decreasing.  She is now able to bend her left wrist.  Lungs still clear.  No evidence of angioedema or impending airway compromise.  Will send home with 6-day prednisone taper, Claritin, Pepcid and EpiPen.  Follow-up with PCP as needed.  Advised her to check her mattress for bedbugs when she  gets home.  ER return precautions given.  Discussed  MDM, treatment plan, and plan for follow-up with patient. Discussed sn/sx that should prompt return to the ED. patient agrees with plan.   Meds ordered this encounter  Medications   predniSONE (DELTASONE) tablet 60 mg   famotidine (PEPCID) tablet 40 mg   diphenhydrAMINE (BENADRYL) capsule 50 mg   EPINEPHrine 0.3 mg/0.3 mL IJ SOAJ injection    Sig: Inject 0.3 mg into the muscle once for 1 dose.    Dispense:  0.3 mL    Refill:  0   famotidine (PEPCID) 20 MG tablet    Sig: Take 1 tablet (20 mg total) by  mouth 2 (two) times daily.    Dispense:  10 tablet    Refill:  0   loratadine (CLARITIN) 10 MG tablet    Sig: Take 1 tablet (10 mg total) by mouth daily.    Dispense:  10 tablet    Refill:  0   predniSONE (STERAPRED UNI-PAK 21 TAB) 10 MG (21) TBPK tablet    Sig: Dispense one 6 day pack. Take as directed with food.    Dispense:  21 tablet    Refill:  0      *This clinic note was created using Scientist, clinical (histocompatibility and immunogenetics). Therefore, there may be occasional mistakes despite careful proofreading.  ?

## 2023-10-10 ENCOUNTER — Telehealth: Payer: Self-pay | Admitting: Family Medicine

## 2023-10-10 ENCOUNTER — Encounter: Payer: MEDICAID | Admitting: Family Medicine

## 2023-10-10 NOTE — Telephone Encounter (Signed)
Called pt and left vm to call office back to reschedule missed appt

## 2023-10-19 ENCOUNTER — Encounter: Payer: Self-pay | Admitting: *Deleted

## 2023-10-19 ENCOUNTER — Ambulatory Visit
Admission: EM | Admit: 2023-10-19 | Discharge: 2023-10-19 | Disposition: A | Discharge status: Home / Self Care | Payer: MEDICAID | Attending: Family Medicine | Admitting: Family Medicine

## 2023-10-19 ENCOUNTER — Other Ambulatory Visit: Payer: Self-pay

## 2023-10-19 DIAGNOSIS — R519 Headache, unspecified: Secondary | ICD-10-CM | POA: Diagnosis present

## 2023-10-19 DIAGNOSIS — R110 Nausea: Secondary | ICD-10-CM | POA: Insufficient documentation

## 2023-10-19 HISTORY — DX: Essential (primary) hypertension: I10

## 2023-10-19 LAB — POCT URINE PREGNANCY: Preg Test, Ur: NEGATIVE

## 2023-10-19 NOTE — ED Triage Notes (Signed)
Pt reports 8 days of nausea, diarrhea and body aches. Headaches also. States she could be pregnant. She needs a note to Return to work

## 2023-10-19 NOTE — Discharge Instructions (Signed)
You have been tested for COVID-19 today. °If your test returns positive, you will receive a phone call from [facility] regarding your results. °Negative test results are not called. °Both positive and negative results area always visible on MyChart. °If you do not have a MyChart account, sign up instructions are provided in your discharge papers. °Please do not hesitate to contact us should you have questions or concerns. ° °

## 2023-10-19 NOTE — ED Provider Notes (Signed)
[facility]   [id number] 10/19/23 Arrival Time: 1149  ASSESSMENT & PLAN:  1. Nausea without vomiting   2. Throbbing headache    Results for orders placed or performed during the hospital encounter of 10/19/23  POCT urine pregnancy   Collection Time: 10/19/23  2:13 PM  Result Value Ref Range   Preg Test, Ur Negative Negative   COVID testing sent at pt request. Work note provided. OTC symptom care as needed.    Follow-up Information     Carla Skeans, MD.   Specialty: Family Medicine Why: As needed. Contact information: [address] [place] [address] [phone]                 Reviewed expectations re: course of current medical issues. Questions answered. Outlined signs and symptoms indicating need for more acute intervention. Understanding verbalized. After Visit Summary given.   SUBJECTIVE: History from: Patient. Carla [name] is a 22 y.o. female. Pt reports 8 days of nausea, diarrhea and body aches. Throbbing headache also. States she could be pregnant. She needs a note to Return to work. Patient's last menstrual period was 09/25/2023. Denies abd pian. Normal urination. Requests COVID testing. Normal PO intake without n/v/d.  OBJECTIVE:  Vitals:   10/19/23 1403  BP: 120/86  Pulse: 95  Resp: 18  Temp: 98.4 F (36.9 C)  TempSrc: Oral  SpO2: 98%    General appearance: alert; no distress Eyes: PERRLA; EOMI; conjunctiva normal HENT: [place]; AT; without nasal congestion Neck: supple  Lungs: speaks full sentences without difficulty; unlabored Abd: benign Extremities: no edema Skin: warm and dry Neurologic: normal gait Psychological: alert and cooperative; normal mood and affect  Labs: Results for orders placed or performed during the hospital encounter of 10/19/23  POCT urine pregnancy   Collection Time: 10/19/23  2:13 PM  Result Value Ref Range   Preg Test, Ur Negative Negative   Labs Reviewed  SARS CORONAVIRUS  2 (TAT 6-24 HRS)  POCT URINE PREGNANCY    Imaging: No results found.  Allergies  Allergen Reactions   Other Hives    "Canned pineapples"    Past Medical History:  Diagnosis Date   Allergy    Canned Pineapples   Anxiety 2018   Hypertension    Obesity    Social History   Socioeconomic History   Marital status: Single    Spouse name: Not on file   Number of children: Not on file   Years of education: Not on file   Highest education level: Not on file  Occupational History   Not on file  Tobacco Use   Smoking status: Never    Passive exposure: Yes   Smokeless tobacco: Never  Vaping Use   Vaping status: Never Used  Substance and Sexual Activity   Alcohol use: No   Drug use: No   Sexual activity: Yes  Other Topics Concern   Not on file  Social History Narrative   Carla [name] is in the 11th grade at [employer]; she does well in school. Mother, Step father, Sister.   Social Drivers of Corporate investment banker Strain: Not on file  Food Insecurity: Not on file  Transportation Needs: Not on file  Physical Activity: Not on file  Stress: Not on file  Social Connections: Unknown (10/13/2022)   [facility]   Social Network    Social Network: Not on file  Intimate Partner Violence: Not At Risk (03/08/2023)   [facility]   HITS    Over the last 12 months how often did your partner physically hurt you?: Never    Over the last 12 months how often did your partner insult you or talk down to you?: Never    Over the last 12 months how often did your partner threaten you with physical harm?: Never    Over the last 12 months how often did your partner scream or curse at you?: Never   Family History  Problem Relation Age of Onset   Obesity Father    Hypertension Father    Obesity Paternal Aunt    Cancer Maternal Grandmother    Lung cancer Maternal Grandfather    Migraines Neg Hx    Seizures Neg Hx     Depression Neg Hx    Anxiety disorder Neg Hx    Bipolar disorder Neg Hx    Schizophrenia Neg Hx    ADD / ADHD Neg Hx    Autism Neg Hx    Past Surgical History:  Procedure Laterality Date   EYE SURGERY       Carla Layman, MD 10/19/23 1445

## 2023-10-20 ENCOUNTER — Ambulatory Visit: Payer: Self-pay

## 2023-10-20 LAB — SARS CORONAVIRUS 2 (TAT 6-24 HRS): SARS Coronavirus 2: NEGATIVE

## 2023-10-26 ENCOUNTER — Ambulatory Visit (INDEPENDENT_AMBULATORY_CARE_PROVIDER_SITE_OTHER): Payer: MEDICAID | Admitting: Family Medicine

## 2023-10-26 ENCOUNTER — Other Ambulatory Visit (HOSPITAL_COMMUNITY)
Admission: RE | Admit: 2023-10-26 | Discharge: 2023-10-26 | Disposition: A | Discharge status: Home / Self Care | Payer: MEDICAID | Source: Ambulatory Visit | Attending: Family Medicine | Admitting: Family Medicine

## 2023-10-26 ENCOUNTER — Encounter: Payer: Self-pay | Admitting: Family Medicine

## 2023-10-26 VITALS — BP 132/84 | HR 74 | Temp 97.9°F | Resp 18 | Ht 67.0 in | Wt 263.0 lb

## 2023-10-26 DIAGNOSIS — R102 Pelvic and perineal pain: Secondary | ICD-10-CM

## 2023-10-26 DIAGNOSIS — Z113 Encounter for screening for infections with a predominantly sexual mode of transmission: Secondary | ICD-10-CM | POA: Insufficient documentation

## 2023-10-26 NOTE — Progress Notes (Signed)
Established Patient Office Visit  Subjective    Patient ID: Carla [name], female    DOB: [birth year]  Age: 22 y.o. MRN: [id number]  CC:  Chief Complaint  Patient presents with   Pelvic Pain    HPI Carla [name] presents with complaint of pelvic pain for about 2 weeks. Patient reports that she does have past history of treated STD. She was concerned she may have PID. Patient denies fever/chills. She is presently sexually active but partner has no known complaints.   Outpatient Encounter Medications as of 10/26/2023  Medication Sig   amLODipine (NORVASC) 5 MG tablet Take 1 tablet (5 mg total) by mouth daily.   diphenhydrAMINE-zinc acetate (BENADRYL EXTRA STRENGTH) cream Apply 1 Application topically 3 (three) times daily as needed for itching. (Patient not taking: Reported on 10/19/2023)   erythromycin ophthalmic ointment Place a 1/2 inch ribbon of ointment into the lower eyelid of both eyes. (Patient not taking: Reported on 10/19/2023)   famotidine (PEPCID) 20 MG tablet Take 1 tablet (20 mg total) by mouth 2 (two) times daily. (Patient not taking: Reported on 10/19/2023)   loratadine (CLARITIN) 10 MG tablet Take 1 tablet (10 mg total) by mouth daily. (Patient not taking: Reported on 10/19/2023)   norgestimate-ethinyl estradiol (ORTHO-CYCLEN) 0.25-35 MG-MCG tablet Take 1 tablet by mouth daily. (Patient not taking: Reported on 10/19/2023)   predniSONE (STERAPRED UNI-PAK 21 TAB) 10 MG (21) TBPK tablet Dispense one 6 day pack. Take as directed with food. (Patient not taking: Reported on 10/19/2023)   triamcinolone cream (KENALOG) 0.1 % Apply 1 Application topically 2 (two) times daily. (Patient not taking: Reported on 10/19/2023)   No facility-administered encounter medications on file as of 10/26/2023.    Past Medical History:  Diagnosis Date   Allergy    Canned Pineapples   Anxiety 2018   Hypertension    Obesity     Past Surgical History:  Procedure Laterality Date   EYE SURGERY       Family History  Problem Relation Age of Onset   Obesity Father    Hypertension Father    Obesity Paternal Aunt    Cancer Maternal Grandmother    Lung cancer Maternal Grandfather    Migraines Neg Hx    Seizures Neg Hx    Depression Neg Hx    Anxiety disorder Neg Hx    Bipolar disorder Neg Hx    Schizophrenia Neg Hx    ADD / ADHD Neg Hx    Autism Neg Hx     Social History   Socioeconomic History   Marital status: Single    Spouse name: Not on file   Number of children: Not on file   Years of education: Not on file   Highest education level: Not on file  Occupational History   Not on file  Tobacco Use   Smoking status: Never    Passive exposure: Yes   Smokeless tobacco: Never  Vaping Use   Vaping status: Never Used  Substance and Sexual Activity   Alcohol use: No   Drug use: No   Sexual activity: Yes  Other Topics Concern   Not on file  Social History Narrative   Nekeia is in the 11th grade at [employer]; she does well in school. Mother, Step father, Sister.   Social Drivers of Corporate investment banker Strain: Low Risk  (10/26/2023)   Overall Financial Resource Strain (CARDIA)    Difficulty of Paying Living Expenses: Not hard at all  Food Insecurity: No Food Insecurity (10/26/2023)   Hunger Vital Sign    Worried About Running Out of Food in the Last Year: Never true    Ran Out of Food in the Last Year: Never true  Transportation Needs: No Transportation Needs (10/26/2023)   PRAPARE - Administrator, Civil Service (Medical): No    Lack of Transportation (Non-Medical): No  Physical Activity: Sufficiently Active (10/26/2023)   Exercise Vital Sign    Days of Exercise per Week: 5 days    Minutes of Exercise per Session: 30 min  Stress: No Stress Concern Present (10/26/2023)   [employer] of Occupational Health - Occupational Stress Questionnaire    Feeling of Stress : Not at all  Social Connections: Moderately Isolated (10/26/2023)    Social Connection and Isolation Panel [NHANES]    Frequency of Communication with Friends and Family: More than three times a week    Frequency of Social Gatherings with Friends and Family: Twice a week    Attends Religious Services: 1 to 4 times per year    Active Member of [employer] or Organizations: No    Attends Banker Meetings: Never    Marital Status: Never married  Intimate Partner Violence: Not At Risk (10/26/2023)   Humiliation, Afraid, Rape, and Kick questionnaire    Fear of Current or Ex-Partner: No    Emotionally Abused: No    Physically Abused: No    Sexually Abused: No    Review of Systems  All other systems reviewed and are negative.       Objective    BP 132/84 (BP Location: Left Arm, Patient Position: Sitting, Cuff Size: Large)   Pulse 74   Temp 97.9 F (36.6 C) (Oral)   Resp 18   Ht 5\' 7"  (1.702 m)   Wt 263 lb (119.3 kg)   LMP 09/25/2023   SpO2 98%   BMI 41.19 kg/m   Physical Exam Vitals and nursing note reviewed.  Constitutional:      General: She is not in acute distress.    Appearance: She is obese.  Cardiovascular:     Rate and Rhythm: Normal rate and regular rhythm.  Pulmonary:     Effort: Pulmonary effort is normal.     Breath sounds: Normal breath sounds.  Abdominal:     Palpations: Abdomen is soft.     Tenderness: There is no abdominal tenderness.  Neurological:     General: No focal deficit present.     Mental Status: She is alert and oriented to person, place, and time.         Assessment & Plan:   Screening for STDs (sexually transmitted diseases) -     Cervicovaginal ancillary only  Pelvic pain -     Ambulatory referral to Obstetrics / Gynecology     Return if symptoms worsen or fail to improve.   Tommie Raymond, MD

## 2023-10-30 ENCOUNTER — Ambulatory Visit: Payer: MEDICAID

## 2023-10-30 LAB — CERVICOVAGINAL ANCILLARY ONLY
Bacterial Vaginitis (gardnerella): POSITIVE — AB
Candida Glabrata: NEGATIVE
Candida Vaginitis: POSITIVE — AB
Chlamydia: NEGATIVE
Comment: NEGATIVE
Comment: NEGATIVE
Comment: NEGATIVE
Comment: NEGATIVE
Comment: NEGATIVE
Comment: NORMAL
Neisseria Gonorrhea: NEGATIVE
Trichomonas: NEGATIVE

## 2023-11-10 ENCOUNTER — Other Ambulatory Visit: Payer: Self-pay | Admitting: Family Medicine

## 2023-11-10 MED ORDER — FLUCONAZOLE 150 MG PO TABS: 150.0000 mg | ORAL_TABLET | Freq: Once | ORAL | 0 refills | Status: AC

## 2023-11-10 MED ORDER — METRONIDAZOLE 500 MG PO TABS: 500.0000 mg | ORAL_TABLET | Freq: Two times a day (BID) | ORAL | 0 refills | Status: AC

## 2023-11-10 NOTE — Progress Notes (Signed)
 Carla [name]

## 2023-11-11 ENCOUNTER — Telehealth: Payer: MEDICAID | Admitting: Nurse Practitioner

## 2023-11-11 DIAGNOSIS — B3731 Acute candidiasis of vulva and vagina: Secondary | ICD-10-CM

## 2023-11-11 MED ORDER — FLUCONAZOLE 150 MG PO TABS
150.0000 mg | ORAL_TABLET | ORAL | 0 refills | Status: DC | PRN
Start: 2023-11-11 — End: 2024-02-21

## 2023-11-11 NOTE — Patient Instructions (Signed)
 Carla [name], thank you for joining [name], NP for today's virtual visit.  While this provider is not your primary care provider (PCP), if your PCP is located in our provider database this encounter information will be shared with them immediately following your visit.   A [facility] MyChart account gives you access to today's visit and all your visits, tests, and labs performed at [facility]  click here if you don't have a [facility] MyChart account or go to [email or web address]  Consent: (Patient) Carla [name] provided verbal consent for this virtual visit at the beginning of the encounter.  Current Medications:  Current Outpatient Medications:    fluconazole  (DIFLUCAN ) 150 MG tablet, Take 1 tablet (150 mg total) by mouth every three (3) days as needed., Disp: 2 tablet, Rfl: 0   amLODipine  (NORVASC ) 5 MG tablet, Take 1 tablet (5 mg total) by mouth daily., Disp: 30 tablet, Rfl: 1   diphenhydrAMINE -zinc  acetate (BENADRYL  EXTRA STRENGTH) cream, Apply 1 Application topically 3 (three) times daily as needed for itching. (Patient not taking: Reported on 10/19/2023), Disp: 28.4 g, Rfl: 0   erythromycin  ophthalmic ointment, Place a 1/2 inch ribbon of ointment into the lower eyelid of both eyes. (Patient not taking: Reported on 10/19/2023), Disp: 3.5 g, Rfl: 0   famotidine  (PEPCID ) 20 MG tablet, Take 1 tablet (20 mg total) by mouth 2 (two) times daily. (Patient not taking: Reported on 10/19/2023), Disp: 10 tablet, Rfl: 0   loratadine  (CLARITIN ) 10 MG tablet, Take 1 tablet (10 mg total) by mouth daily. (Patient not taking: Reported on 10/19/2023), Disp: 10 tablet, Rfl: 0   metroNIDAZOLE  (FLAGYL ) 500 MG tablet, Take 1 tablet (500 mg total) by mouth 2 (two) times daily for 7 days., Disp: 14 tablet, Rfl: 0   norgestimate -ethinyl estradiol  (ORTHO-CYCLEN) 0.25-35 MG-MCG tablet, Take 1 tablet by mouth daily. (Patient not taking: Reported on 10/19/2023), Disp: 84 tablet, Rfl: 0    predniSONE  (STERAPRED UNI-PAK 21 TAB) 10 MG (21) TBPK tablet, Dispense one 6 day pack. Take as directed with food. (Patient not taking: Reported on 10/19/2023), Disp: 21 tablet, Rfl: 0   triamcinolone  cream (KENALOG ) 0.1 %, Apply 1 Application topically 2 (two) times daily. (Patient not taking: Reported on 10/19/2023), Disp: 30 g, Rfl: 0   Medications ordered in this encounter:  Meds ordered this encounter  Medications   fluconazole  (DIFLUCAN ) 150 MG tablet    Sig: Take 1 tablet (150 mg total) by mouth every three (3) days as needed.    Dispense:  2 tablet    Refill:  0    Supervising Provider:   LAMPTEY, PHILIP O [[id number]]     *If you need refills on other medications prior to your next appointment, please contact your pharmacy*  Follow-Up: Call back or seek an in-person evaluation if the symptoms worsen or if the condition fails to improve as anticipated.  [facility] [phone]   If you have been instructed to have an in-person evaluation today at a local Urgent Care facility, please use the link below. It will take you to a list of all of our available [facility], including address, phone number and hours of operation. Please do not delay care.  [facility]  If you or a family member do not have a primary care provider, use the link below to schedule a visit and establish care. When you choose a [facility] primary care physician or advanced practice provider, you gain a  long-term partner in health. Find a Primary Care Provider  Learn more about [facility]'s in-office and virtual care options: [facility] - Get Care Now

## 2023-11-11 NOTE — Progress Notes (Signed)
 Virtual Visit Consent   Carla [name], you are scheduled for a virtual visit with a [facility] provider today. Just as with appointments in the office, your consent must be obtained to participate. Your consent will be active for this visit and any virtual visit you may have with one of our providers in the next 365 days. If you have a MyChart account, a copy of this consent can be sent to you electronically.  As this is a virtual visit, video technology does not allow for your provider to perform a traditional examination. This may limit your provider's ability to fully assess your condition. If your provider identifies any concerns that need to be evaluated in person or the need to arrange testing (such as labs, EKG, etc.), we will make arrangements to do so. Although advances in technology are sophisticated, we cannot ensure that it will always work on either your end or our end. If the connection with a video visit is poor, the visit may have to be switched to a telephone visit. With either a video or telephone visit, we are not always able to ensure that we have a secure connection.  By engaging in this virtual visit, you consent to the provision of healthcare and authorize for your insurance to be billed (if applicable) for the services provided during this visit. Depending on your insurance coverage, you may receive a charge related to this service.  I need to obtain your verbal consent now. Are you willing to proceed with your visit today? Carla [name] has provided verbal consent on 11/11/2023 for a virtual visit (video or telephone). Carla LELON Servant, NP  Date: 11/11/2023 12:21 PM  Virtual Visit via Video Note   I, Carla [name], connected with  Carla [name]  ([id number], Oct 11, 2001) on 11/11/23 at 12:15 PM EST by a video-enabled telemedicine application and verified that I am speaking with the correct person using two identifiers.  Location: Patient: Virtual Visit Location Patient:  Home Provider: Virtual Visit Location Provider: Home Office   I discussed the limitations of evaluation and management by telemedicine and the availability of in person appointments. The patient expressed understanding and agreed to proceed.    History of Present Illness: Carla [name] is a 23 y.o. who identifies as a female who was assigned female at birth, and is being seen today for yeast vaginitis.  Carla [name] states she has thick, white clumpy vaginal discharge. She was recently treated for BV and yeast and believes she has yeast again. She is sexually active with one partner. Denies using any vaginal washes   Problems:  Patient Active Problem List   Diagnosis Date Noted   Throbbing headache 12/06/2019   MDD (major depressive disorder), severe (HCC) 12/07/2017    Allergies:  Allergies  Allergen Reactions   Other Hives    Canned pineapples   Medications:  Current Outpatient Medications:    fluconazole  (DIFLUCAN ) 150 MG tablet, Take 1 tablet (150 mg total) by mouth every three (3) days as needed., Disp: 2 tablet, Rfl: 0   amLODipine  (NORVASC ) 5 MG tablet, Take 1 tablet (5 mg total) by mouth daily., Disp: 30 tablet, Rfl: 1   diphenhydrAMINE -zinc  acetate (BENADRYL  EXTRA STRENGTH) cream, Apply 1 Application topically 3 (three) times daily as needed for itching. (Patient not taking: Reported on 10/19/2023), Disp: 28.4 g, Rfl: 0   erythromycin  ophthalmic ointment, Place a 1/2 inch ribbon of ointment into the lower eyelid of both eyes. (Patient not taking: Reported on 10/19/2023), Disp: 3.5  g, Rfl: 0   famotidine  (PEPCID ) 20 MG tablet, Take 1 tablet (20 mg total) by mouth 2 (two) times daily. (Patient not taking: Reported on 10/19/2023), Disp: 10 tablet, Rfl: 0   loratadine  (CLARITIN ) 10 MG tablet, Take 1 tablet (10 mg total) by mouth daily. (Patient not taking: Reported on 10/19/2023), Disp: 10 tablet, Rfl: 0   metroNIDAZOLE  (FLAGYL ) 500 MG tablet, Take 1 tablet (500 mg total) by mouth 2  (two) times daily for 7 days., Disp: 14 tablet, Rfl: 0   norgestimate -ethinyl estradiol  (ORTHO-CYCLEN) 0.25-35 MG-MCG tablet, Take 1 tablet by mouth daily. (Patient not taking: Reported on 10/19/2023), Disp: 84 tablet, Rfl: 0   predniSONE  (STERAPRED UNI-PAK 21 TAB) 10 MG (21) TBPK tablet, Dispense one 6 day pack. Take as directed with food. (Patient not taking: Reported on 10/19/2023), Disp: 21 tablet, Rfl: 0   triamcinolone  cream (KENALOG ) 0.1 %, Apply 1 Application topically 2 (two) times daily. (Patient not taking: Reported on 10/19/2023), Disp: 30 g, Rfl: 0  Observations/Objective: Patient is well-developed, well-nourished in no acute distress.  Resting comfortably  at home.  Head is normocephalic, atraumatic.  No labored breathing.  Speech is clear and coherent with logical content.  Patient is alert and oriented at baseline.    Assessment and Plan: 1. Yeast vaginitis (Primary) - fluconazole  (DIFLUCAN ) 150 MG tablet; Take 1 tablet (150 mg total) by mouth every three (3) days as needed.  Dispense: 2 tablet; Refill: 0    Follow Up Instructions: I discussed the assessment and treatment plan with the patient. The patient was provided an opportunity to ask questions and all were answered. The patient agreed with the plan and demonstrated an understanding of the instructions.  A copy of instructions were sent to the patient via MyChart unless otherwise noted below.    The patient was advised to call back or seek an in-person evaluation if the symptoms worsen or if the condition fails to improve as anticipated.    Carla [name] W Thor Nannini, NP

## 2023-11-22 ENCOUNTER — Encounter: Payer: Self-pay | Admitting: Family Medicine

## 2023-11-22 ENCOUNTER — Ambulatory Visit: Payer: MEDICAID | Admitting: Family Medicine

## 2023-11-22 NOTE — Telephone Encounter (Signed)
Will route to Admin team!

## 2023-11-27 ENCOUNTER — Telehealth: Payer: MEDICAID

## 2023-12-28 ENCOUNTER — Encounter: Payer: MEDICAID | Admitting: Obstetrics and Gynecology

## 2024-02-12 ENCOUNTER — Other Ambulatory Visit: Payer: Self-pay

## 2024-02-12 ENCOUNTER — Ambulatory Visit
Admission: RE | Admit: 2024-02-12 | Discharge: 2024-02-12 | Disposition: A | Discharge status: Home / Self Care | Payer: MEDICAID | Source: Ambulatory Visit | Attending: Emergency Medicine | Admitting: Emergency Medicine

## 2024-02-12 VITALS — BP 161/91 | HR 87 | Temp 98.0°F | Resp 18

## 2024-02-12 DIAGNOSIS — J069 Acute upper respiratory infection, unspecified: Secondary | ICD-10-CM

## 2024-02-12 MED ORDER — AMOXICILLIN-POT CLAVULANATE 875-125 MG PO TABS: 1.0000 | ORAL_TABLET | Freq: Two times a day (BID) | ORAL | 0 refills | Status: DC

## 2024-02-12 MED ORDER — BENZONATATE 100 MG PO CAPS: 100.0000 mg | ORAL_CAPSULE | Freq: Three times a day (TID) | ORAL | 0 refills | Status: DC

## 2024-02-12 MED ORDER — PROMETHAZINE-DM 6.25-15 MG/5ML PO SYRP: 5.0000 mL | ORAL_SOLUTION | Freq: Every evening | ORAL | 0 refills | Status: AC | PRN

## 2024-02-12 NOTE — ED Triage Notes (Signed)
 Pt here for cough and congestion x 14 days with productive cough; pt also sts some diarrhea

## 2024-02-12 NOTE — Discharge Instructions (Addendum)
 Take Augmentin every morning and every evening for 7 days to clear bacteria most likely causing symptoms to linger  May use Tessalon pill every 8 hours as needed, may use cough syrup at bedtime to help you rest    You can take Tylenol and/or Ibuprofen as needed for fever reduction and pain relief.   For cough: honey 1/2 to 1 teaspoon (you can dilute the honey in water or another fluid). You can use a humidifier for chest congestion and cough.  If you don't have a humidifier, you can sit in the bathroom with the hot shower running.      For sore throat: try warm salt water gargles, cepacol lozenges, throat spray, warm tea or water with lemon/honey, popsicles or ice, or OTC cold relief medicine for throat discomfort.   For congestion: take a daily anti-histamine like Zyrtec, Claritin, and a oral decongestant, such as pseudoephedrine.  You can also use Flonase 1-2 sprays in each nostril daily.   It is important to stay hydrated: drink plenty of fluids (water, gatorade/powerade/pedialyte, juices, or teas) to keep your throat moisturized and help further relieve irritation/discomfort.

## 2024-02-12 NOTE — ED Provider Notes (Signed)
 [facility]    CSN: [id number] Arrival date & time: 02/12/24  6962      History   Chief Complaint Chief Complaint  Patient presents with   Cough    HPI Carla [name] is a 23 y.o. female.   Patient presents for evaluation of fever, nasal congestion, rhinorrhea, sore throat, productive cough with light green sputum, wheezing, intermittent headaches and diarrhea present for 2 weeks.  Associated nausea but denies vomiting, has been able to tolerate food and liquids but appetite is decreased.  Known sick contacts at work.  Has attempted use of antihistamines, Zofran, Tylenol and an over-the-counter day and night cold medication.  Denies shortness of breath.  Denies respiratory history, non-smoker.  Past Medical History:  Diagnosis Date   Allergy    Canned Pineapples   Anxiety 2018   Hypertension    Obesity     Patient Active Problem List   Diagnosis Date Noted   Throbbing headache 12/06/2019   MDD (major depressive disorder), severe (HCC) 12/07/2017    Past Surgical History:  Procedure Laterality Date   EYE SURGERY      OB History   No obstetric history on file.      Home Medications    Prior to Admission medications   Medication Sig Start Date End Date Taking? Authorizing Provider  amLODipine (NORVASC) 5 MG tablet Take 1 tablet (5 mg total) by mouth daily. 08/13/23   Al Decant, PA-C  diphenhydrAMINE-zinc acetate (BENADRYL EXTRA STRENGTH) cream Apply 1 Application topically 3 (three) times daily as needed for itching. Patient not taking: Reported on 10/19/2023 02/27/23   Theron Arista, PA-C  erythromycin ophthalmic ointment Place a 1/2 inch ribbon of ointment into the lower eyelid of both eyes. Patient not taking: Reported on 10/19/2023 01/31/23   Michelle Piper, PA-C  famotidine (PEPCID) 20 MG tablet Take 1 tablet (20 mg total) by mouth 2 (two) times daily. Patient not taking: Reported on 10/19/2023 09/15/23   Domenick Gong, MD   fluconazole (DIFLUCAN) 150 MG tablet Take 1 tablet (150 mg total) by mouth every three (3) days as needed. 11/11/23   Claiborne Rigg, NP  loratadine (CLARITIN) 10 MG tablet Take 1 tablet (10 mg total) by mouth daily. Patient not taking: Reported on 10/19/2023 09/15/23   Domenick Gong, MD  norgestimate-ethinyl estradiol (ORTHO-CYCLEN) 0.25-35 MG-MCG tablet Take 1 tablet by mouth daily. Patient not taking: Reported on 10/19/2023 06/23/22   [name], MD  predniSONE (STERAPRED UNI-PAK 21 TAB) 10 MG (21) TBPK tablet Dispense one 6 day pack. Take as directed with food. Patient not taking: Reported on 10/19/2023 09/15/23   Domenick Gong, MD  triamcinolone cream (KENALOG) 0.1 % Apply 1 Application topically 2 (two) times daily. Patient not taking: Reported on 10/19/2023 02/27/23   Theron Arista, PA-C    Family History Family History  Problem Relation Age of Onset   Obesity Father    Hypertension Father    Obesity Paternal Aunt    Cancer Maternal Grandmother    Lung cancer Maternal Grandfather    Migraines Neg Hx    Seizures Neg Hx    Depression Neg Hx    Anxiety disorder Neg Hx    Bipolar disorder Neg Hx    Schizophrenia Neg Hx    ADD / ADHD Neg Hx    Autism Neg Hx     Social History Social History   Tobacco Use   Smoking status: Never    Passive exposure: Yes  Smokeless tobacco: Never  Vaping Use   Vaping status: Never Used  Substance Use Topics   Alcohol use: No   Drug use: No     Allergies   Other   Review of Systems Review of Systems  Constitutional:  Positive for fever. Negative for activity change, appetite change, chills, diaphoresis, fatigue and unexpected weight change.  HENT:  Positive for congestion, rhinorrhea and sore throat. Negative for dental problem, drooling, ear discharge, ear pain, facial swelling, hearing loss, mouth sores, nosebleeds, postnasal drip, sinus pressure, sinus pain, sneezing, tinnitus, trouble swallowing and voice change.    Respiratory:  Positive for cough and wheezing. Negative for apnea, choking, chest tightness, shortness of breath and stridor.   Gastrointestinal:  Positive for diarrhea and nausea. Negative for abdominal distention, abdominal pain, anal bleeding, blood in stool, constipation, rectal pain and vomiting.  Neurological:  Positive for headaches. Negative for dizziness, tremors, seizures, syncope, facial asymmetry, speech difficulty, weakness, light-headedness and numbness.     Physical Exam Triage Vital Signs ED Triage Vitals  Encounter Vitals Group     BP 02/12/24 0937 (!) 161/91     Systolic BP Percentile --      Diastolic BP Percentile --      Pulse Rate 02/12/24 0937 87     Resp 02/12/24 0937 18     Temp 02/12/24 0937 98 F (36.7 C)     Temp Source 02/12/24 0937 Oral     SpO2 02/12/24 0937 96 %     Weight --      Height --      Head Circumference --      Peak Flow --      Pain Score 02/12/24 0938 0     Pain Loc --      Pain Education --      Exclude from Growth Chart --    No data found.  Updated Vital Signs BP (!) 161/91 (BP Location: Left Arm)   Pulse 87   Temp 98 F (36.7 C) (Oral)   Resp 18   SpO2 96%   Visual Acuity Right Eye Distance:   Left Eye Distance:   Bilateral Distance:    Right Eye Near:   Left Eye Near:    Bilateral Near:     Physical Exam Constitutional:      Appearance: Normal appearance.  HENT:     Right Ear: Tympanic membrane, ear canal and external ear normal.     Left Ear: Tympanic membrane, ear canal and external ear normal.     Nose: Congestion present.     Mouth/Throat:     Pharynx: No oropharyngeal exudate or posterior oropharyngeal erythema.  Eyes:     Extraocular Movements: Extraocular movements intact.  Cardiovascular:     Rate and Rhythm: Normal rate and regular rhythm.     Pulses: Normal pulses.     Heart sounds: Normal heart sounds.  Pulmonary:     Effort: Pulmonary effort is normal.     Breath sounds: Normal breath  sounds.  Musculoskeletal:     Cervical back: Normal range of motion.  Lymphadenopathy:     Cervical: Cervical adenopathy present.  Neurological:     Mental Status: She is alert and oriented to person, place, and time. Mental status is at baseline.      UC Treatments / Results  Labs (all labs ordered are listed, but only abnormal results are displayed) Labs Reviewed - No data to display  EKG   Radiology No results  found.  Procedures Procedures (including critical care time)  Medications Ordered in UC Medications - No data to display  Initial Impression / Assessment and Plan / UC Course  I have reviewed the triage vital signs and the nursing notes.  Pertinent labs & imaging results that were available during my care of the patient were reviewed by me and considered in my medical decision making (see chart for details).  Acute URI  Patient is in no signs of distress nor toxic appearing.  Vital signs are stable.  Low suspicion for pneumonia, pneumothorax or bronchitis and therefore will defer imaging.  Viral testing deferred due to timeline of illness.  Symptoms present for 2 weeks without signs of resolution will provide bacterial coverage, Augmentin prescribed as well as Tessalon and Promethazine DM for supportive care.May use additional over-the-counter medications as needed for supportive care.  May follow-up with urgent care as needed if symptoms persist or worsen.    Final Clinical Impressions(s) / UC Diagnoses   Final diagnoses:  None   Discharge Instructions   None    ED Prescriptions   None    PDMP not reviewed this encounter.   Reena Canning, [place] 02/12/24 [phone]

## 2024-02-13 ENCOUNTER — Other Ambulatory Visit: Payer: Self-pay

## 2024-02-13 ENCOUNTER — Emergency Department (HOSPITAL_COMMUNITY): Payer: MEDICAID

## 2024-02-13 ENCOUNTER — Encounter (HOSPITAL_COMMUNITY): Payer: Self-pay | Admitting: *Deleted

## 2024-02-13 ENCOUNTER — Emergency Department (HOSPITAL_COMMUNITY)
Admission: EM | Admit: 2024-02-13 | Discharge: 2024-02-13 | Disposition: A | Discharge status: Home / Self Care | Payer: MEDICAID | Attending: Emergency Medicine | Admitting: Emergency Medicine

## 2024-02-13 DIAGNOSIS — I10 Essential (primary) hypertension: Secondary | ICD-10-CM | POA: Insufficient documentation

## 2024-02-13 DIAGNOSIS — Z79899 Other long term (current) drug therapy: Secondary | ICD-10-CM | POA: Diagnosis not present

## 2024-02-13 DIAGNOSIS — J069 Acute upper respiratory infection, unspecified: Secondary | ICD-10-CM | POA: Diagnosis not present

## 2024-02-13 DIAGNOSIS — R197 Diarrhea, unspecified: Secondary | ICD-10-CM | POA: Insufficient documentation

## 2024-02-13 LAB — CBC
HCT: 38.7 % (ref 36.0–46.0)
Hemoglobin: 12.8 g/dL (ref 12.0–15.0)
MCH: 30.7 pg (ref 26.0–34.0)
MCHC: 33.1 g/dL (ref 30.0–36.0)
MCV: 92.8 fL (ref 80.0–100.0)
Platelets: 321 10*3/uL (ref 150–400)
RBC: 4.17 MIL/uL (ref 3.87–5.11)
RDW: 13.8 % (ref 11.5–15.5)
WBC: 4.8 10*3/uL (ref 4.0–10.5)
nRBC: 0 % (ref 0.0–0.2)

## 2024-02-13 LAB — COMPREHENSIVE METABOLIC PANEL WITH GFR
ALT: 11 U/L (ref 0–44)
AST: 19 U/L (ref 15–41)
Albumin: 4 g/dL (ref 3.5–5.0)
Alkaline Phosphatase: 50 U/L (ref 38–126)
Anion gap: 8 (ref 5–15)
BUN: 11 mg/dL (ref 6–20)
CO2: 25 mmol/L (ref 22–32)
Calcium: 9 mg/dL (ref 8.9–10.3)
Chloride: 105 mmol/L (ref 98–111)
Creatinine, Ser: 0.78 mg/dL (ref 0.44–1.00)
GFR, Estimated: >60 mL/min (ref >60)
Glucose, Bld: 89 mg/dL (ref 70–99)
Potassium: 3.6 mmol/L (ref 3.5–5.1)
Sodium: 138 mmol/L (ref 135–145)
Total Bilirubin: 0.3 mg/dL (ref 0.0–1.2)
Total Protein: 7.6 g/dL (ref 6.5–8.1)

## 2024-02-13 LAB — RESP PANEL BY RT-PCR (RSV, FLU A&B, COVID)  RVPGX2
Influenza A by PCR: NEGATIVE
Influenza B by PCR: NEGATIVE
Resp Syncytial Virus by PCR: NEGATIVE
SARS Coronavirus 2 by RT PCR: NEGATIVE

## 2024-02-13 LAB — HCG, SERUM, QUALITATIVE: Preg, Serum: NEGATIVE

## 2024-02-13 MED ORDER — ALBUTEROL SULFATE HFA 108 (90 BASE) MCG/ACT IN AERS
1.0000 | INHALATION_SPRAY | Freq: Four times a day (QID) | RESPIRATORY_TRACT | 0 refills | Status: AC | PRN
Start: 2024-02-13 — End: ?

## 2024-02-13 NOTE — ED Notes (Signed)
 Patient requests a pregnancy test. She reports spotting, decrease in length of period.

## 2024-02-13 NOTE — ED Triage Notes (Signed)
 Pt comes in today with irregular period, diarrhea and feels like she can't breath well. Seen at [facility] yesterday. Productive cough

## 2024-02-13 NOTE — ED Provider Notes (Signed)
 [place] EMERGENCY DEPARTMENT AT [facility] Provider Note   CSN: [id number] Arrival date & time: 02/13/24  1239     History  Chief Complaint  Patient presents with   Diarrhea   URI    Carla [name] is a 23 y.o. female with medical history of obesity, hypertension, allergies and anxiety.  The patient presents to the ED for evaluation of numerous complaints.  She is requesting COVID testing, pregnancy testing.  She reports that she is currently spotting.  Reports that she is set to start her menstrual cycle on 4/20 but reports that her menstrual cycle is early.  She states she is sexually active and concerned that she is pregnant.  She denies any abdominal pain, nausea or vomiting.  Denies any vaginal discharge or dysuria.  Denies fevers.  Also here requesting COVID-19 testing.  Reports she has had a few days of cough, shortness of breath.  She was seen at urgent care yesterday and diagnosed with acute URI but urgent care provider did not test her offer x-ray imaging for patient.  She would like these to be conducted today.  She denies any chest pain, lightheadedness or dizziness on standing.  She is also reporting 2 weeks of diarrhea that is nonbloody.  Denies any new antibiotics, recent travel, new foods.   Diarrhea Associated symptoms: URI   URI      Home Medications Prior to Admission medications   Medication Sig Start Date End Date Taking? Authorizing Provider  albuterol (VENTOLIN HFA) 108 (90 Base) MCG/ACT inhaler Inhale 1-2 puffs into the lungs every 6 (six) hours as needed for wheezing or shortness of breath. 02/13/24  Yes Adel Aden, PA-C  amLODipine (NORVASC) 5 MG tablet Take 1 tablet (5 mg total) by mouth daily. 08/13/23   Adel Aden, PA-C  amoxicillin-clavulanate (AUGMENTIN) 875-125 MG tablet Take 1 tablet by mouth every 12 (twelve) hours. 02/12/24   White, Maybelle Spatz, NP  benzonatate (TESSALON) 100 MG capsule Take 1 capsule (100 mg total) by  mouth every 8 (eight) hours. 02/12/24   White, Maybelle Spatz, NP  diphenhydrAMINE-zinc acetate (BENADRYL EXTRA STRENGTH) cream Apply 1 Application topically 3 (three) times daily as needed for itching. Patient not taking: Reported on 10/19/2023 02/27/23   Delray Fielding, PA-C  erythromycin ophthalmic ointment Place a 1/2 inch ribbon of ointment into the lower eyelid of both eyes. Patient not taking: Reported on 10/19/2023 01/31/23   Sherida Dimmer, PA-C  famotidine (PEPCID) 20 MG tablet Take 1 tablet (20 mg total) by mouth 2 (two) times daily. Patient not taking: Reported on 10/19/2023 09/15/23   Mortenson, Ashley, MD  fluconazole (DIFLUCAN) 150 MG tablet Take 1 tablet (150 mg total) by mouth every three (3) days as needed. 11/11/23   Fleming, Zelda W, NP  loratadine (CLARITIN) 10 MG tablet Take 1 tablet (10 mg total) by mouth daily. Patient not taking: Reported on 10/19/2023 09/15/23   Mortenson, Ashley, MD  norgestimate-ethinyl estradiol (ORTHO-CYCLEN) 0.25-35 MG-MCG tablet Take 1 tablet by mouth daily. Patient not taking: Reported on 10/19/2023 06/23/22   Abraham Abo, MD  predniSONE (STERAPRED UNI-PAK 21 TAB) 10 MG (21) TBPK tablet Dispense one 6 day pack. Take as directed with food. Patient not taking: Reported on 10/19/2023 09/15/23   Mortenson, Ashley, MD  promethazine-dextromethorphan (PROMETHAZINE-DM) 6.25-15 MG/5ML syrup Take 5 mLs by mouth at bedtime as needed for cough. 02/12/24   White, Maybelle Spatz, NP  triamcinolone cream (KENALOG) 0.1 % Apply 1 Application topically 2 (two)  times daily. Patient not taking: Reported on 10/19/2023 02/27/23   Theron Arista, PA-C      Allergies    Other    Review of Systems   Review of Systems  Respiratory:  Positive for shortness of breath.   Gastrointestinal:  Positive for diarrhea.  Genitourinary:  Positive for vaginal bleeding.  All other systems reviewed and are negative.   Physical Exam Updated Vital Signs BP (!) 154/105   Pulse 65   Temp  98.3 F (36.8 C) (Oral)   Resp 16   Ht 5\' 7"  (1.702 m)   LMP  (LMP Unknown)   SpO2 99%   BMI 41.19 kg/m  Physical Exam Vitals and nursing note reviewed.  Constitutional:      General: She is not in acute distress.    Appearance: She is well-developed.  HENT:     Head: Normocephalic and atraumatic.  Eyes:     Conjunctiva/sclera: Conjunctivae normal.  Cardiovascular:     Rate and Rhythm: Normal rate and regular rhythm.     Heart sounds: No murmur heard. Pulmonary:     Effort: Pulmonary effort is normal. No respiratory distress.     Breath sounds: Normal breath sounds.     Comments: Lungs CTAB Abdominal:     Palpations: Abdomen is soft.     Tenderness: There is no abdominal tenderness.     Comments: No abdominal TTP, no overlying skin change, no rebound or guarding  Musculoskeletal:        General: No swelling.     Cervical back: Neck supple.  Skin:    General: Skin is warm and dry.     Capillary Refill: Capillary refill takes less than 2 seconds.  Neurological:     Mental Status: She is alert and oriented to person, place, and time.  Psychiatric:        Mood and Affect: Mood normal.     ED Results / Procedures / Treatments   Labs (all labs ordered are listed, but only abnormal results are displayed) Labs Reviewed  RESP PANEL BY RT-PCR (RSV, FLU A&B, COVID)  [id number]  CBC  COMPREHENSIVE METABOLIC PANEL WITH GFR  HCG, SERUM, QUALITATIVE    EKG None  Radiology No results found.  Procedures Procedures   Medications Ordered in ED Medications - No data to display  ED Course/ Medical Decision Making/ A&P  Medical Decision Making Amount and/or Complexity of Data Reviewed Labs: ordered. Radiology: ordered.   23 year old female presents for evaluation.  Please see HPI for further details.  On examination patient is afebrile and nontachycardic.  Lung sounds are clear bilaterally, she is not a toxic, oxygen saturation 99% room air.  Abdomen soft and  compressible throughout with no tenderness.  Neuroexam at baseline.  Will collect CBC, CMP in the setting of persistent diarrhea to ensure no electrolyte derangement or leukocytosis.  Will also collect hCG serum qualitative pregnancy test, viral panel.  Also a chest x-ray.  Patient CBC without leukocytosis or anemia.  Metabolic panel without electrolyte derangement, elevated LFTs, anion gap is 8.  Patient viral panel negative for all.  Pregnancy test negative.  Chest x-ray unremarkable.  At this time we will discharge patient home with albuterol inhaler for shortness of breath.  Will have her follow-up with her PCP.  She was advised to return to the ED with any new or worsening symptoms and he voiced understanding.  Stable to discharge.   Final Clinical Impression(s) / ED Diagnoses Final diagnoses:  Diarrhea, unspecified  type  Viral URI with cough    Rx / DC Orders ED Discharge Orders          Ordered    albuterol (VENTOLIN HFA) 108 (90 Base) MCG/ACT inhaler  Every 6 hours PRN        02/13/24 1707              Adel Aden, PA-C 02/13/24 1707    Trish Furl, MD 02/14/24 [phone]

## 2024-02-13 NOTE — Discharge Instructions (Addendum)
 It was a pleasure taking part in your care.  As discussed, your workup is reassuring.  Your pregnancy test is negative.  Your blood work shows that your electrolytes are all within normal limits.  Please continue to push fluids such as Pedialyte or Body Armor at home.  Please follow-up with your PCP.  Utilize albuterol inhaler every 6 hours as needed for shortness of breath.  Return to the ED with any new or worsening symptoms.  Please utilize safe sex practices in the future.

## 2024-02-13 NOTE — ED Notes (Signed)
Patient left prior to the completion of the discharge process.

## 2024-02-15 ENCOUNTER — Telehealth: Payer: MEDICAID | Admitting: Physician Assistant

## 2024-02-15 ENCOUNTER — Telehealth: Payer: MEDICAID

## 2024-02-15 DIAGNOSIS — N760 Acute vaginitis: Secondary | ICD-10-CM

## 2024-02-15 NOTE — Progress Notes (Signed)
  Because yellow/green discharge and lower back pain are not common with yeast infections, and are more concerning for other causes of vaginitis , I feel your condition warrants further evaluation and I recommend that you be seen in a face-to-face visit.   NOTE: There will be NO CHARGE for this E-Visit   If you are having a true medical emergency, please call 911.     For an urgent face to face visit, [place] has multiple urgent care centers for your convenience.  Click the link below for the full list of locations and hours, walk-in wait times, appointment scheduling options and driving directions:  Urgent Care - [place], [place], [place], [place], [place], [place]  [place]     Your MyChart E-visit questionnaire answers were reviewed by a board certified advanced clinical practitioner to complete your personal care plan based on your specific symptoms.    Thank you for using e-Visits.

## 2024-02-19 ENCOUNTER — Other Ambulatory Visit: Payer: Self-pay | Admitting: Nurse Practitioner

## 2024-02-19 ENCOUNTER — Encounter: Payer: Self-pay | Admitting: Family Medicine

## 2024-02-19 DIAGNOSIS — B3731 Acute candidiasis of vulva and vagina: Secondary | ICD-10-CM

## 2024-02-21 ENCOUNTER — Other Ambulatory Visit: Payer: Self-pay

## 2024-02-21 ENCOUNTER — Encounter: Payer: Self-pay | Admitting: Obstetrics and Gynecology

## 2024-02-21 ENCOUNTER — Other Ambulatory Visit (HOSPITAL_COMMUNITY)
Admission: RE | Admit: 2024-02-21 | Discharge: 2024-02-21 | Disposition: A | Discharge status: Home / Self Care | Payer: MEDICAID | Source: Ambulatory Visit | Attending: Obstetrics and Gynecology | Admitting: Obstetrics and Gynecology

## 2024-02-21 ENCOUNTER — Ambulatory Visit (INDEPENDENT_AMBULATORY_CARE_PROVIDER_SITE_OTHER): Payer: MEDICAID | Admitting: Obstetrics and Gynecology

## 2024-02-21 VITALS — BP 137/95 | HR 80 | Ht 67.0 in | Wt 263.0 lb

## 2024-02-21 DIAGNOSIS — Z3202 Encounter for pregnancy test, result negative: Secondary | ICD-10-CM

## 2024-02-21 DIAGNOSIS — B9689 Other specified bacterial agents as the cause of diseases classified elsewhere: Secondary | ICD-10-CM | POA: Insufficient documentation

## 2024-02-21 DIAGNOSIS — R102 Pelvic and perineal pain: Secondary | ICD-10-CM | POA: Diagnosis not present

## 2024-02-21 DIAGNOSIS — Z113 Encounter for screening for infections with a predominantly sexual mode of transmission: Secondary | ICD-10-CM | POA: Insufficient documentation

## 2024-02-21 DIAGNOSIS — Z124 Encounter for screening for malignant neoplasm of cervix: Secondary | ICD-10-CM | POA: Insufficient documentation

## 2024-02-21 DIAGNOSIS — B3731 Acute candidiasis of vulva and vagina: Secondary | ICD-10-CM | POA: Insufficient documentation

## 2024-02-21 DIAGNOSIS — N760 Acute vaginitis: Secondary | ICD-10-CM | POA: Insufficient documentation

## 2024-02-21 DIAGNOSIS — F5083 Pica in adults: Secondary | ICD-10-CM

## 2024-02-21 DIAGNOSIS — Z01419 Encounter for gynecological examination (general) (routine) without abnormal findings: Secondary | ICD-10-CM

## 2024-02-21 DIAGNOSIS — N946 Dysmenorrhea, unspecified: Secondary | ICD-10-CM

## 2024-02-21 DIAGNOSIS — K5909 Other constipation: Secondary | ICD-10-CM

## 2024-02-21 DIAGNOSIS — N898 Other specified noninflammatory disorders of vagina: Secondary | ICD-10-CM | POA: Diagnosis not present

## 2024-02-21 MED ORDER — IBUPROFEN 600 MG PO TABS
600.0000 mg | ORAL_TABLET | Freq: Four times a day (QID) | ORAL | 4 refills | Status: AC | PRN
Start: 2024-02-21 — End: ?

## 2024-02-21 NOTE — Progress Notes (Signed)
 GYNECOLOGY ANNUAL PREVENTATIVE CARE ENCOUNTER NOTE  History:     Carla [name] is a 23 y.o. G0 female here for a routine annual gynecologic exam.  Current complaints: "pelvic pain". Pt notes lower abdominal pain.  Pain noted for 3-4 months, worsened  5 days ago.  The pain is intermittent.  Pain is sharp and dull, radiates down the thighs.  Pt has mild nausea just before a cycle. Constipation is noted.  Pt continues to have daily BM.  Pt denies dysuria.  Patient denies dyspareunia.  Menstrual cycles are regular lasting 5 days.  Menses are uncomfortable,  but responds to NSAIDs Gynecologic History Patient's last menstrual period was 02/05/2024 (exact date). Contraception: condoms Last Pap: n/a Last mammogram: n/a  Obstetric History OB History  No obstetric history on file.    Past Medical History:  Diagnosis Date   Allergy    Canned Pineapples   Anxiety 2018   Hypertension    Obesity     Past Surgical History:  Procedure Laterality Date   EYE SURGERY      Current Outpatient Medications on File Prior to Visit  Medication Sig Dispense Refill   albuterol  (VENTOLIN  HFA) 108 (90 Base) MCG/ACT inhaler Inhale 1-2 puffs into the lungs every 6 (six) hours as needed for wheezing or shortness of breath. 18 g 0   amLODipine  (NORVASC ) 5 MG tablet Take 1 tablet (5 mg total) by mouth daily. 30 tablet 1   promethazine -dextromethorphan (PROMETHAZINE -DM) 6.25-15 MG/5ML syrup Take 5 mLs by mouth at bedtime as needed for cough. 118 mL 0   amoxicillin -clavulanate (AUGMENTIN ) 875-125 MG tablet Take 1 tablet by mouth every 12 (twelve) hours. (Patient not taking: Reported on 02/21/2024) 14 tablet 0   benzonatate  (TESSALON ) 100 MG capsule Take 1 capsule (100 mg total) by mouth every 8 (eight) hours. (Patient not taking: Reported on 02/21/2024) 21 capsule 0   fluconazole  (DIFLUCAN ) 150 MG tablet Take 1 tablet (150 mg total) by mouth every three (3) days as needed. 2 tablet 0   norgestimate -ethinyl  estradiol  (ORTHO-CYCLEN) 0.25-35 MG-MCG tablet Take 1 tablet by mouth daily. (Patient not taking: Reported on 10/19/2023) 84 tablet 0   No current facility-administered medications on file prior to visit.    Allergies  Allergen Reactions   Other Hives    "Canned pineapples"    Social History:  reports that she has never smoked. She has been exposed to tobacco smoke. She has never used smokeless tobacco. She reports that she does not drink alcohol and does not use drugs.  Family History  Problem Relation Age of Onset   Obesity Father    Hypertension Father    Obesity Paternal Aunt    Cancer Maternal Grandmother    Lung cancer Maternal Grandfather    Migraines Neg Hx    Seizures Neg Hx    Depression Neg Hx    Anxiety disorder Neg Hx    Bipolar disorder Neg Hx    Schizophrenia Neg Hx    ADD / ADHD Neg Hx    Autism Neg Hx     The following portions of the patient's history were reviewed and updated as appropriate: allergies, current medications, past family history, past medical history, past social history, past surgical history and problem list.  Review of Systems Pertinent items noted in HPI and remainder of comprehensive ROS otherwise negative.  Physical Exam:  BP (!) 137/95   Pulse 80   Ht 5\' 7"  (1.702 m)   Wt 263 lb (119.3  kg)   LMP 02/05/2024 (Exact Date)   BMI 41.19 kg/m  CONSTITUTIONAL: Well-developed, slightly obese, well-nourished female in no acute distress.  HENT:  Normocephalic, atraumatic, External right and left ear normal. Oropharynx is clear and moist EYES: Conjunctivae and EOM are normal.  NECK: Normal range of motion, supple, no masses.  Normal thyroid.  SKIN: Skin is warm and dry. No rash noted. Not diaphoretic. No erythema. No pallor. MUSCULOSKELETAL: Normal range of motion. No tenderness.  No cyanosis, clubbing, or edema.  2+ distal pulses. NEUROLOGIC: Alert and oriented to person, place, and time. Normal reflexes, muscle tone coordination.   PSYCHIATRIC: Normal mood and affect. Normal behavior. Normal judgment and thought content. CARDIOVASCULAR: Normal heart rate noted, regular rhythm RESPIRATORY: Clear to auscultation bilaterally. Effort and breath sounds normal, no problems with respiration noted. BREASTS:deferred ABDOMEN: Soft, no distention noted.  No tenderness, rebound or guarding.  PELVIC: Normal appearing external genitalia and urethral meatus; normal appearing vaginal mucosa and cervix.  No abnormal discharge noted.  Pap smear obtained.  Vaginal swab taken. Normal uterine size, no other palpable masses, no uterine or adnexal tenderness.  Performed in the presence of a chaperone.   Assessment and Plan:    1. Pelvic pain (Primary) No reproduction of pain during exam Do not believe pain is from gyn source, can consider pelvic ultrasound if needed. Pt does have constipation and this may contribute to some mild abdominal discomfort  2. Cervical cancer screening Pap taken without incident  3. Women's annual routine gynecological examination Normal annual exam Pt is not on any BCM currently  Will follow up results of pap smear and manage accordingly. Routine preventative health maintenance measures emphasized. Please refer to After Visit Summary for other counseling recommendations.      Avie Boeck, MD, FACOG Obstetrician & Gynecologist, [facility] for [facility], [facility]

## 2024-02-22 LAB — CERVICOVAGINAL ANCILLARY ONLY
Bacterial Vaginitis (gardnerella): POSITIVE — AB
Candida Glabrata: NEGATIVE
Candida Vaginitis: POSITIVE — AB
Chlamydia: NEGATIVE
Comment: NEGATIVE
Comment: NEGATIVE
Comment: NEGATIVE
Comment: NEGATIVE
Comment: NEGATIVE
Comment: NORMAL
Neisseria Gonorrhea: NEGATIVE
Trichomonas: NEGATIVE

## 2024-02-22 LAB — POCT PREGNANCY, URINE: Preg Test, Ur: NEGATIVE

## 2024-02-26 ENCOUNTER — Inpatient Hospital Stay: Payer: MEDICAID | Admitting: Family Medicine

## 2024-02-26 ENCOUNTER — Telehealth: Payer: Self-pay | Admitting: Lactation Services

## 2024-02-26 LAB — CYTOLOGY - PAP: Diagnosis: NEGATIVE

## 2024-02-26 NOTE — Telephone Encounter (Signed)
-----   Message from Abigail Abler sent at 02/25/2024  2:44 PM EDT ----- BV and yeast noted on swab, offer treatment

## 2024-02-26 NOTE — Telephone Encounter (Signed)
 Attempted to call patient to inform her or normal Pap Smear and that swab showed BV and yeast and to offer treatment for her. Patient did not answer. LM for patient to call the office at [phone] at her convenience for non urgent results.

## 2024-03-06 ENCOUNTER — Other Ambulatory Visit: Payer: Self-pay | Admitting: Lactation Services

## 2024-03-06 MED ORDER — METRONIDAZOLE 500 MG PO TABS: 500.0000 mg | ORAL_TABLET | Freq: Two times a day (BID) | ORAL | 0 refills | Status: AC

## 2024-03-06 MED ORDER — FLUCONAZOLE 150 MG PO TABS: 150.0000 mg | ORAL_TABLET | Freq: Once | ORAL | 0 refills | Status: AC

## 2024-03-06 NOTE — Telephone Encounter (Signed)
 Attempted to call patient to give results and to offer treatment for BV and yeast. She did not answer. LM with information to call the office at her convenience and to check her Mychart message for more details.

## 2024-05-16 ENCOUNTER — Telehealth: Payer: Self-pay

## 2024-05-16 NOTE — Telephone Encounter (Signed)
Called pt   No answer   LVM to call back.

## 2024-05-16 NOTE — Telephone Encounter (Signed)
 Copied from CRM [id number]. Topic: General - Other >> May 16, 2024  1:04 PM Emylou G wrote: Reason for CRM: Pls call patient about taking pregnancy test and after morning pill - poss abortion option?

## 2024-06-26 ENCOUNTER — Ambulatory Visit: Payer: Self-pay

## 2024-08-04 ENCOUNTER — Telehealth: Payer: MEDICAID | Admitting: Family Medicine

## 2024-08-04 DIAGNOSIS — B3731 Acute candidiasis of vulva and vagina: Secondary | ICD-10-CM

## 2024-08-04 MED ORDER — FLUCONAZOLE 150 MG PO TABS: 150.0000 mg | ORAL_TABLET | Freq: Every day | ORAL | 0 refills | Status: AC

## 2024-08-04 NOTE — Patient Instructions (Signed)

## 2024-08-04 NOTE — Progress Notes (Signed)

## 2024-08-06 NOTE — Progress Notes (Signed)
 Virtual Visit Consent   Carla [name], you are scheduled for a virtual visit with a [facility] provider today. Just as with appointments in the office, your consent must be obtained to participate. Your consent will be active for this visit and any virtual visit you may have with one of our providers in the next 365 days. If you have a MyChart account, a copy of this consent can be sent to you electronically.  As this is a virtual visit, video technology does not allow for your provider to perform a traditional examination. This may limit your provider's ability to fully assess your condition. If your provider identifies any concerns that need to be evaluated in person or the need to arrange testing (such as labs, EKG, etc.), we will make arrangements to do so. Although advances in technology are sophisticated, we cannot ensure that it will always work on either your end or our end. If the connection with a video visit is poor, the visit may have to be switched to a telephone visit. With either a video or telephone visit, we are not always able to ensure that we have a secure connection.  By engaging in this virtual visit, you consent to the provision of healthcare and authorize for your insurance to be billed (if applicable) for the services provided during this visit. Depending on your insurance coverage, you may receive a charge related to this service.  I need to obtain your verbal consent now. Are you willing to proceed with your visit today? Carla [name] has provided verbal consent on 08/06/2024 for a virtual visit (video or telephone). Carla Lamp, FNP  Date: 08/06/2024 4:03 PM   Virtual Visit via Video Note   I, Carla [name], connected with  Carla [name]  ([id number], 21-Aug-2001) on 08/06/24 at  2:15 PM EDT by a video-enabled telemedicine application and verified that I am speaking with the correct person using two identifiers.  Location: Patient: Virtual Visit Location Patient: Home Provider:  Virtual Visit Location Provider: Home Office   I discussed the limitations of evaluation and management by telemedicine and the availability of in person appointments. The patient expressed understanding and agreed to proceed.    History of Present Illness: Carla [name] is a 23 y.o. who identifies as a female who was assigned female at birth, and is being seen today for vaginal itching with thick white discharge for 2 days requesting diflucan . No fever or abd pain. SABRA  HPI: HPI  Problems:  Patient Active Problem List   Diagnosis Date Noted   Throbbing headache 12/06/2019   MDD (major depressive disorder), severe (HCC) 12/07/2017    Allergies:  Allergies  Allergen Reactions   Other Hives    Canned pineapples   Medications:  Current Outpatient Medications:    albuterol  (VENTOLIN  HFA) 108 (90 Base) MCG/ACT inhaler, Inhale 1-2 puffs into the lungs every 6 (six) hours as needed for wheezing or shortness of breath., Disp: 18 g, Rfl: 0   amLODipine  (NORVASC ) 5 MG tablet, Take 1 tablet (5 mg total) by mouth daily., Disp: 30 tablet, Rfl: 1   ibuprofen  (ADVIL ) 600 MG tablet, Take 1 tablet (600 mg total) by mouth every 6 (six) hours as needed for headache, mild pain (pain score 1-3), moderate pain (pain score 4-6) or cramping., Disp: 30 tablet, Rfl: 4   metroNIDAZOLE  (FLAGYL ) 500 MG tablet, Take 1 tablet (500 mg total) by mouth 2 (two) times daily., Disp: 14 tablet, Rfl: 0   promethazine -dextromethorphan (PROMETHAZINE -DM) 6.25-15 MG/5ML syrup,  Take 5 mLs by mouth at bedtime as needed for cough., Disp: 118 mL, Rfl: 0  Observations/Objective: Patient is well-developed, well-nourished in no acute distress.  Resting comfortably  at home.  Head is normocephalic, atraumatic.  No labored breathing.  Speech is clear and coherent with logical content.  Patient is alert and oriented at baseline.    Assessment and Plan: 1. Candidiasis of vagina (Primary)  GYN as needed.   Follow Up  Instructions: I discussed the assessment and treatment plan with the patient. The patient was provided an opportunity to ask questions and all were answered. The patient agreed with the plan and demonstrated an understanding of the instructions.  A copy of instructions were sent to the patient via MyChart unless otherwise noted below.     The patient was advised to call back or seek an in-person evaluation if the symptoms worsen or if the condition fails to improve as anticipated.    Shira Bobst, FNP

## 2024-10-31 ENCOUNTER — Encounter (HOSPITAL_COMMUNITY): Payer: Self-pay

## 2024-10-31 ENCOUNTER — Other Ambulatory Visit: Payer: Self-pay

## 2024-10-31 ENCOUNTER — Emergency Department (HOSPITAL_COMMUNITY)
Admission: EM | Admit: 2024-10-31 | Discharge: 2024-11-01 | Disposition: A | Discharge status: Home / Self Care | Payer: MEDICAID | Attending: Emergency Medicine | Admitting: Emergency Medicine

## 2024-10-31 DIAGNOSIS — I10 Essential (primary) hypertension: Secondary | ICD-10-CM | POA: Insufficient documentation

## 2024-10-31 DIAGNOSIS — R197 Diarrhea, unspecified: Secondary | ICD-10-CM | POA: Diagnosis not present

## 2024-10-31 DIAGNOSIS — R112 Nausea with vomiting, unspecified: Secondary | ICD-10-CM | POA: Insufficient documentation

## 2024-10-31 DIAGNOSIS — R1013 Epigastric pain: Secondary | ICD-10-CM | POA: Diagnosis not present

## 2024-10-31 DIAGNOSIS — Z79899 Other long term (current) drug therapy: Secondary | ICD-10-CM | POA: Diagnosis not present

## 2024-10-31 NOTE — ED Triage Notes (Signed)
 Pt to ED by [facility] for abdominal epigastric pain for the past 2 days. Pt endorses NVD since eating little ceasars 2 days ago. Arrives A+O, VSS, NADN.

## 2024-11-01 ENCOUNTER — Emergency Department (HOSPITAL_COMMUNITY): Payer: MEDICAID

## 2024-11-01 ENCOUNTER — Encounter (HOSPITAL_COMMUNITY): Payer: Self-pay

## 2024-11-01 LAB — URINALYSIS, ROUTINE W REFLEX MICROSCOPIC
Bacteria, UA: NONE SEEN
Bilirubin Urine: NEGATIVE
Glucose, UA: NEGATIVE mg/dL
Hgb urine dipstick: NEGATIVE
Ketones, ur: 5 mg/dL — AB
Leukocytes,Ua: NEGATIVE
Nitrite: NEGATIVE
Protein, ur: 30 mg/dL — AB
Specific Gravity, Urine: 1.033 — ABNORMAL HIGH (ref 1.005–1.030)
pH: 5 (ref 5.0–8.0)

## 2024-11-01 LAB — CBC WITH DIFFERENTIAL/PLATELET
Abs Immature Granulocytes: 0.01 K/uL (ref 0.00–0.07)
Basophils Absolute: 0 K/uL (ref 0.0–0.1)
Basophils Relative: 0 %
Eosinophils Absolute: 0 K/uL (ref 0.0–0.5)
Eosinophils Relative: 1 %
HCT: 39.3 % (ref 36.0–46.0)
Hemoglobin: 13.2 g/dL (ref 12.0–15.0)
Immature Granulocytes: 0 %
Lymphocytes Relative: 30 %
Lymphs Abs: 1.3 K/uL (ref 0.7–4.0)
MCH: 30.8 pg (ref 26.0–34.0)
MCHC: 33.6 g/dL (ref 30.0–36.0)
MCV: 91.8 fL (ref 80.0–100.0)
Monocytes Absolute: 0.4 K/uL (ref 0.1–1.0)
Monocytes Relative: 9 %
Neutro Abs: 2.6 K/uL (ref 1.7–7.7)
Neutrophils Relative %: 60 %
Platelets: 295 K/uL (ref 150–400)
RBC: 4.28 MIL/uL (ref 3.87–5.11)
RDW: 13.7 % (ref 11.5–15.5)
WBC: 4.4 K/uL (ref 4.0–10.5)
nRBC: 0 % (ref 0.0–0.2)

## 2024-11-01 LAB — COMPREHENSIVE METABOLIC PANEL WITH GFR
ALT: 6 U/L (ref 0–44)
AST: 22 U/L (ref 15–41)
Albumin: 4.4 g/dL (ref 3.5–5.0)
Alkaline Phosphatase: 57 U/L (ref 38–126)
Anion gap: 10 (ref 5–15)
BUN: 9 mg/dL (ref 6–20)
CO2: 27 mmol/L (ref 22–32)
Calcium: 9.3 mg/dL (ref 8.9–10.3)
Chloride: 104 mmol/L (ref 98–111)
Creatinine, Ser: 0.71 mg/dL (ref 0.44–1.00)
GFR, Estimated: >60 mL/min
Glucose, Bld: 97 mg/dL (ref 70–99)
Potassium: 3.4 mmol/L — ABNORMAL LOW (ref 3.5–5.1)
Sodium: 141 mmol/L (ref 135–145)
Total Bilirubin: 0.2 mg/dL (ref 0.0–1.2)
Total Protein: 7.5 g/dL (ref 6.5–8.1)

## 2024-11-01 LAB — PREGNANCY, URINE: Preg Test, Ur: NEGATIVE

## 2024-11-01 LAB — LIPASE, BLOOD: Lipase: 25 U/L (ref 11–51)

## 2024-11-01 MED ORDER — POTASSIUM CHLORIDE CRYS ER 20 MEQ PO TBCR
20.0000 meq | EXTENDED_RELEASE_TABLET | Freq: Once | ORAL | Status: AC
  Administered 2024-11-01: 20 meq via ORAL
  Filled 2024-11-01: qty 1

## 2024-11-01 MED ORDER — PANTOPRAZOLE SODIUM 40 MG IV SOLR
40.0000 mg | Freq: Once | INTRAVENOUS | Status: AC
  Administered 2024-11-01: 40 mg via INTRAVENOUS
  Filled 2024-11-01: qty 10

## 2024-11-01 MED ORDER — DICYCLOMINE HCL 10 MG PO CAPS
10.0000 mg | ORAL_CAPSULE | Freq: Once | ORAL | Status: AC
  Administered 2024-11-01: 10 mg via ORAL
  Filled 2024-11-01: qty 1

## 2024-11-01 MED ORDER — ONDANSETRON HCL 4 MG/2ML IJ SOLN
4.0000 mg | Freq: Once | INTRAMUSCULAR | Status: AC
  Administered 2024-11-01: 4 mg via INTRAVENOUS
  Filled 2024-11-01: qty 2

## 2024-11-01 MED ORDER — IOHEXOL 300 MG/ML  SOLN
75.0000 mL | Freq: Once | INTRAMUSCULAR | Status: AC | PRN
  Administered 2024-11-01: 100 mL via INTRAVENOUS

## 2024-11-01 MED ORDER — CAPSAICIN 0.025 % EX CREA
TOPICAL_CREAM | CUTANEOUS | Status: AC
  Filled 2024-11-01: qty 60

## 2024-11-01 MED ORDER — ONDANSETRON 4 MG PO TBDP: 4.0000 mg | ORAL_TABLET | Freq: Three times a day (TID) | ORAL | 0 refills | Status: AC | PRN

## 2024-11-01 MED ORDER — SODIUM CHLORIDE 0.9 % IV BOLUS
1000.0000 mL | Freq: Once | INTRAVENOUS | Status: AC
  Administered 2024-11-01: 1000 mL via INTRAVENOUS

## 2024-11-01 NOTE — ED Notes (Signed)
 Lab running urine pregnancy to be able to get CT scan

## 2024-11-01 NOTE — Discharge Instructions (Signed)
 As we discussed, your work appears reassuring.  You most likely are experiencing a foodborne illness.  Please take Zofran every 6 hours as needed for nausea.  Follow-up with your PCP.  Return to the ED with new symptoms.

## 2024-11-01 NOTE — ED Provider Notes (Signed)
 " [place] [facility] AT [facility] Provider Note   CSN: [id number] Arrival date & time: 10/31/24  2218     Patient presents with: Abdominal Pain   Carla [name] is a 24 y.o. female with history of allergies, anxiety, hypertension.  Presents to ED complaining of abdominal pain, nausea and vomiting.  States that for the last 3 days has had abdominal pain associated with nausea vomiting and diarrhea.  Reports that she ate little Caesar's 3 days ago and after, about 20 minutes later, began to experience abdominal cramping, diarrhea and nausea.  States that she has been unable to keep much down over the last 3 days secondary to her nausea.  Endorsing diarrhea but reports that today her diarrhea has improved and she is now having solid stools.  States that her stools have been slightly darker but attributes this to taking Pepto-Bismol.  Denies blood thinners.  Reports abdominal pain which is nonfocal in nature.  Denies any dysuria, flank pain, discharge, fevers at home.  Denies chest pain or shortness of breath.  Denies recent travel, antibiotics.  Reports he recently finished her menstrual cycle and was taking Tylenol  for this but denies any ibuprofen  or recent alcohol.   Abdominal Pain Associated symptoms: diarrhea, nausea and vomiting        Prior to Admission medications  Medication Sig Start Date End Date Taking? Authorizing Provider  acetaminophen  (TYLENOL ) 500 MG tablet Take 500 mg by mouth every 6 (six) hours as needed.   Yes [provider]  bismuth subsalicylate (PEPTO BISMOL) 262 MG/15ML suspension Take 15-30 mLs by mouth every 6 (six) hours as needed.   Yes [provider]  ondansetron (ZOFRAN-ODT) 4 MG disintegrating tablet Take 1 tablet (4 mg total) by mouth every 8 (eight) hours as needed for nausea or vomiting. 11/01/24  Yes Ruthell Lonni FALCON, PA-C  albuterol  (VENTOLIN  HFA) 108 (90 Base) MCG/ACT inhaler Inhale 1-2 puffs into the lungs every 6  (six) hours as needed for wheezing or shortness of breath. Patient not taking: Reported on 10/31/2024 02/13/24   Ruthell Lonni FALCON, PA-C  amLODipine  (NORVASC ) 5 MG tablet Take 1 tablet (5 mg total) by mouth daily. Patient not taking: Reported on 10/31/2024 08/13/23   Ruthell Lonni FALCON, PA-C  ibuprofen  (ADVIL ) 600 MG tablet Take 1 tablet (600 mg total) by mouth every 6 (six) hours as needed for headache, mild pain (pain score 1-3), moderate pain (pain score 4-6) or cramping. Patient not taking: Reported on 10/31/2024 02/21/24   Zina Jerilynn LABOR, MD  metroNIDAZOLE  (FLAGYL ) 500 MG tablet Take 1 tablet (500 mg total) by mouth 2 (two) times daily. Patient not taking: Reported on 10/31/2024 03/06/24   Zina Jerilynn LABOR, MD  promethazine -dextromethorphan (PROMETHAZINE -DM) 6.25-15 MG/5ML syrup Take 5 mLs by mouth at bedtime as needed for cough. Patient not taking: Reported on 10/31/2024 02/12/24   Teresa Shelba SAUNDERS, NP    Allergies: Other    Review of Systems  Gastrointestinal:  Positive for abdominal pain, diarrhea, nausea and vomiting. Negative for blood in stool.  All other systems reviewed and are negative.   Updated Vital Signs BP (!) 146/75   Pulse 85   Temp 98.1 F (36.7 C)   Resp 16   Ht 5' 7 (1.702 m)   Wt 119 kg   SpO2 99%   BMI 41.09 kg/m   Physical Exam Vitals and nursing note reviewed.  Constitutional:      General: She is not in acute distress.  Appearance: She is well-developed.  HENT:     Head: Normocephalic and atraumatic.  Eyes:     Conjunctiva/sclera: Conjunctivae normal.  Cardiovascular:     Rate and Rhythm: Normal rate and regular rhythm.     Heart sounds: No murmur heard. Pulmonary:     Effort: Pulmonary effort is normal. No respiratory distress.     Breath sounds: Normal breath sounds.  Abdominal:     Palpations: Abdomen is soft.     Tenderness: There is abdominal tenderness in the epigastric area.  Musculoskeletal:        General: No swelling.     Cervical  back: Neck supple.  Skin:    General: Skin is warm and dry.     Capillary Refill: Capillary refill takes less than 2 seconds.  Neurological:     Mental Status: She is alert.  Psychiatric:        Mood and Affect: Mood normal.     (all labs ordered are listed, but only abnormal results are displayed) Labs Reviewed  COMPREHENSIVE METABOLIC PANEL WITH GFR - Abnormal; Notable for the following components:      Result Value   Potassium 3.4 (*)    All other components within normal limits  URINALYSIS, ROUTINE W REFLEX MICROSCOPIC - Abnormal; Notable for the following components:   APPearance HAZY (*)    Specific Gravity, Urine 1.033 (*)    Ketones, ur 5 (*)    Protein, ur 30 (*)    All other components within normal limits  CBC WITH DIFFERENTIAL/PLATELET  LIPASE, BLOOD  PREGNANCY, URINE    EKG: None  Radiology: CT ABDOMEN PELVIS W CONTRAST Result Date: 11/01/2024 EXAM: CT ABDOMEN AND PELVIS WITH CONTRAST 11/01/2024 04:42:17 AM TECHNIQUE: CT of the abdomen and pelvis was performed with the administration of intravenous contrast. Multiplanar reformatted images are provided for review. Automated exposure control, iterative reconstruction, and/or weight-based adjustment of the mA/kV was utilized to reduce the radiation dose to as low as reasonably achievable. COMPARISON: None available. CLINICAL HISTORY: Abdominal pain, acute, nonlocalized FINDINGS: LOWER CHEST: No acute abnormality. LIVER: The liver is unremarkable. GALLBLADDER AND BILE DUCTS: Gallbladder is unremarkable. No biliary ductal dilatation. SPLEEN: No acute abnormality. PANCREAS: No acute abnormality. ADRENAL GLANDS: No acute abnormality. KIDNEYS, URETERS AND BLADDER: No stones in the kidneys or ureters. No hydronephrosis. No perinephric or periureteral stranding. Urinary bladder is unremarkable. GI AND BOWEL: Stomach demonstrates no acute abnormality. There is no bowel obstruction. PERITONEUM AND RETROPERITONEUM: No ascites. No free  air. VASCULATURE: Aorta is normal in caliber. LYMPH NODES: No lymphadenopathy. REPRODUCTIVE ORGANS: No acute abnormality. BONES AND SOFT TISSUES: No acute osseous abnormality. No focal soft tissue abnormality. IMPRESSION: 1. No acute findings in the abdomen or pelvis. Electronically signed by: Evalene Coho MD 11/01/2024 05:17 AM EST RP Workstation: [id number]    Procedures   Medications Ordered in the ED  potassium chloride SA (KLOR-CON M) CR tablet 20 mEq (has no administration in time range)  ondansetron (ZOFRAN) injection 4 mg (4 mg Intravenous Given 11/01/24 0043)  sodium chloride  0.9 % bolus 1,000 mL (1,000 mLs Intravenous New Bag/Given 11/01/24 0044)  pantoprazole (PROTONIX) injection 40 mg (40 mg Intravenous Given 11/01/24 0043)  dicyclomine (BENTYL) capsule 10 mg (10 mg Oral Given 11/01/24 0233)  capsaicin (ZOSTRIX) 0.025 % cream ( Topical Given 11/01/24 0244)  iohexol (OMNIPAQUE) 300 MG/ML solution 75 mL (100 mLs Intravenous Contrast Given 11/01/24 0407)    Medical Decision Making Amount and/or Complexity of Data Reviewed Labs: ordered. Radiology:  ordered.  Risk OTC drugs. Prescription drug management.   24 year old female presents for evaluation of nausea vomiting and diarrhea.  On exam, HD stable.  Lung sounds are clear bilaterally, no hypoxia.  Abdomen soft and compressible with tenderness noted in epigastric region without rebound or guarding, no overlying skin change.  Neurological examination is at baseline.  Overall nontoxic in appearance.  Will assess with CBC, CMP, lipase, urinalysis, urine pregnancy, CT abdomen pelvis.  Patient given Zofran, a liter of fluid, Protonix.  CBC without leukocytosis or anemia.  Metabolic panel shows potassium 3.4 repleted with 20 mEq oral potassium.  No other electrolyte derangement.  Anion gap 10.  Urinalysis with ketones and protein but no bacteria, leukocytes or nitrites.  Lipase 25.  Pregnancy test negative.  CT abdomen pelvis  unremarkable.  Suspect food borne illness in this patient.  She has tolerated p.o. intake here.  Will send home with Zofran.  Patient advised to treat symptoms conservatively at home.  Advised to follow-up outpatient with PCP.  She was given return precautions and she voiced understanding.  Stable to discharge.   Final diagnoses:  Nausea vomiting and diarrhea    ED Discharge Orders          Ordered    ondansetron (ZOFRAN-ODT) 4 MG disintegrating tablet  Every 8 hours PRN        11/01/24 0527               Ruthell Lonni FALCON, PA-C 11/01/24 9453    Jerral Meth, MD 11/01/24 [phone]  "

## 2024-11-02 ENCOUNTER — Other Ambulatory Visit: Payer: Self-pay

## 2024-11-02 ENCOUNTER — Encounter (HOSPITAL_COMMUNITY): Payer: Self-pay

## 2024-11-02 ENCOUNTER — Emergency Department (HOSPITAL_COMMUNITY)
Admission: EM | Admit: 2024-11-02 | Discharge: 2024-11-02 | Discharge status: Against Medical Advice | Payer: MEDICAID | Source: Home / Self Care | Attending: Emergency Medicine | Admitting: Emergency Medicine

## 2024-11-02 DIAGNOSIS — R197 Diarrhea, unspecified: Secondary | ICD-10-CM | POA: Insufficient documentation

## 2024-11-02 DIAGNOSIS — Z5329 Procedure and treatment not carried out because of patient's decision for other reasons: Secondary | ICD-10-CM | POA: Insufficient documentation

## 2024-11-02 DIAGNOSIS — I10 Essential (primary) hypertension: Secondary | ICD-10-CM | POA: Diagnosis not present

## 2024-11-02 NOTE — ED Triage Notes (Signed)
 Pt. Arrives pov for watery diarrhea. States that she was told that she had a food borne illness yesterday when she was seen. Pt. Denies vomiting today, but reports abdominal pain and a headache. States that she is unable to hold her BM.

## 2024-11-02 NOTE — ED Notes (Signed)
 Pt eloped from department. Dr. Theadore notified.

## 2024-11-03 NOTE — ED Provider Notes (Signed)
 " [facility] Emergency Department Provider Note MRN:  [id number]  Arrival date & time: 11/03/2024     Chief Complaint   Diarrhea   History of Present Illness   Carla [name] is a 24 y.o. year-old female with no pertinent past medical history presenting to the ED with chief complaint of diarrhea.  Persistent loose stool and poor p.o. intake for the past several days.  Denies fever.  No significant abdominal pain.  Review of Systems  A thorough review of systems was obtained and all systems are negative except as noted in the HPI and PMH.   Patient's Health History    Past Medical History:  Diagnosis Date   Allergy    Canned Pineapples   Anxiety 2018   Hypertension    Obesity     Past Surgical History:  Procedure Laterality Date   EYE SURGERY      Family History  Problem Relation Age of Onset   Obesity Father    Hypertension Father    Obesity Paternal Aunt    Cancer Maternal Grandmother    Lung cancer Maternal Grandfather    Migraines Neg Hx    Seizures Neg Hx    Depression Neg Hx    Anxiety disorder Neg Hx    Bipolar disorder Neg Hx    Schizophrenia Neg Hx    ADD / ADHD Neg Hx    Autism Neg Hx     Social History   Socioeconomic History   Marital status: Single    Spouse name: Not on file   Number of children: Not on file   Years of education: Not on file   Highest education level: Not on file  Occupational History   Not on file  Tobacco Use   Smoking status: Never    Passive exposure: Yes   Smokeless tobacco: Never  Vaping Use   Vaping status: Never Used  Substance and Sexual Activity   Alcohol use: No   Drug use: No   Sexual activity: Yes  Other Topics Concern   Not on file  Social History Narrative   Falon is in the 11th grade at [employer]; she does well in school. Mother, Step father, Sister.   Social Drivers of Health   Tobacco Use: Medium Risk (11/02/2024)   Patient History    Smoking Tobacco Use: Never     Smokeless Tobacco Use: Never    Passive Exposure: Yes  Financial Resource Strain: Low Risk (10/26/2023)   Overall Financial Resource Strain (CARDIA)    Difficulty of Paying Living Expenses: Not hard at all  Food Insecurity: No Food Insecurity (10/26/2023)   Hunger Vital Sign    Worried About Running Out of Food in the Last Year: Never true    Ran Out of Food in the Last Year: Never true  Transportation Needs: No Transportation Needs (10/26/2023)   PRAPARE - Administrator, Civil Service (Medical): No    Lack of Transportation (Non-Medical): No  Physical Activity: Sufficiently Active (10/26/2023)   Exercise Vital Sign    Days of Exercise per Week: 5 days    Minutes of Exercise per Session: 30 min  Stress: No Stress Concern Present (10/26/2023)   [employer] of Occupational Health - Occupational Stress Questionnaire    Feeling of Stress : Not at all  Social Connections: Moderately Isolated (10/26/2023)   Social Connection and Isolation Panel    Frequency of Communication with Friends and Family: More than three times  a week    Frequency of Social Gatherings with Friends and Family: Twice a week    Attends Religious Services: 1 to 4 times per year    Active Member of [employer] or Organizations: No    Attends Banker Meetings: Never    Marital Status: Never married  Intimate Partner Violence: Not At Risk (10/26/2023)   Humiliation, Afraid, Rape, and Kick questionnaire    Fear of Current or Ex-Partner: No    Emotionally Abused: No    Physically Abused: No    Sexually Abused: No  Depression (PHQ2-9): Low Risk (02/21/2024)   Depression (PHQ2-9)    PHQ-2 Score: 1  Alcohol Screen: Low Risk (10/26/2023)   Alcohol Screen    Last Alcohol Screening Score (AUDIT): 0  Housing: Unknown (10/26/2023)   Housing Stability Vital Sign    Unable to Pay for Housing in the Last Year: No    Number of Times Moved in the Last Year: Not on file    Homeless in the Last Year: No   Utilities: Not At Risk (10/26/2023)   AHC Utilities    Threatened with loss of utilities: No  Health Literacy: Adequate Health Literacy (10/26/2023)   B1300 Health Literacy    Frequency of need for help with medical instructions: Never     Physical Exam   Vitals:   11/02/24 0050  BP: (!) 159/119  Pulse: 84  Resp: 18  Temp: 97.8 F (36.6 C)  SpO2: 100%    CONSTITUTIONAL: Well-appearing, NAD NEURO/PSYCH:  Alert and oriented x 3, no focal deficits EYES:  eyes equal and reactive ENT/NECK:  no LAD, no JVD CARDIO: Regular rate, well-perfused, normal S1 and S2 PULM:  CTAB no wheezing or rhonchi GI/GU:  non-distended, non-tender MSK/SPINE:  No gross deformities, no edema SKIN:  no rash, atraumatic   *Additional and/or pertinent findings included in MDM below  Diagnostic and Interventional Summary    EKG Interpretation Date/Time:    Ventricular Rate:    PR Interval:    QRS Duration:    QT Interval:    QTC Calculation:   R Axis:      Text Interpretation:         Labs Reviewed - No data to display  No orders to display    Medications - No data to display   Procedures  /  Critical Care Procedures  ED Course and Medical Decision Making  Initial Impression and Ddx Suspect continued symptoms of gastroenteritis.  And abdomen completely soft and nontender with no rebound guarding rigidity.  Reassuring vital signs.  Patient feels dehydrated, will plan to recheck labs, provide fluids, reassess.  Past medical/surgical history that increases complexity of ED encounter: None  Interpretation of Diagnostics None  Patient Reassessment and Ultimate Disposition/Management     Patient became upset that she was in a hallway bed, eloped from the emergency department without telling staff.  Incomplete workup.  Patient management required discussion with the following services or consulting groups:  None  Complexity of Problems Addressed Acute illness or injury that poses  threat of life of bodily function  Additional Data Reviewed and Analyzed Further history obtained from: Further history from spouse/family member  Additional Factors Impacting ED Encounter Risk Consideration of hospitalization  Ozell HERO. Theadore, MD [facility] Emergency Medicine [facility] [email or web address]  Final Clinical Impressions(s) / ED Diagnoses     ICD-10-CM   1. Diarrhea, unspecified type  R19.7       ED Discharge Orders  None        Discharge Instructions Discussed with and Provided to Patient:   Discharge Instructions   None      Theadore Ozell HERO, MD 11/03/24 [phone]  "
